# Patient Record
Sex: Female | Born: 1950
Health system: Southern US, Community
[De-identification: ages and names within clinical notes are randomized; demographics above are authoritative.]

## PROBLEM LIST (undated history)

## (undated) DIAGNOSIS — M1990 Unspecified osteoarthritis, unspecified site: Secondary | ICD-10-CM

## (undated) DIAGNOSIS — E039 Hypothyroidism, unspecified: Secondary | ICD-10-CM

## (undated) DIAGNOSIS — E7800 Pure hypercholesterolemia, unspecified: Secondary | ICD-10-CM

## (undated) HISTORY — DX: Hypothyroidism, unspecified: E03.9

## (undated) HISTORY — DX: Unspecified osteoarthritis, unspecified site: M19.90

## (undated) HISTORY — DX: Pure hypercholesterolemia, unspecified: E78.00

---

## 1969-12-31 HISTORY — PX: TONSILLECTOMY: SUR1361

## 1992-12-31 HISTORY — PX: VAGINAL HYSTERECTOMY: SUR661

## 1999-02-27 ENCOUNTER — Other Ambulatory Visit: Admission: RE | Admit: 1999-02-27 | Discharge: 1999-02-27 | Payer: Self-pay | Admitting: *Deleted

## 2000-04-09 ENCOUNTER — Other Ambulatory Visit: Admission: RE | Admit: 2000-04-09 | Discharge: 2000-04-09 | Payer: Self-pay | Admitting: *Deleted

## 2001-04-29 ENCOUNTER — Other Ambulatory Visit: Admission: RE | Admit: 2001-04-29 | Discharge: 2001-04-29 | Payer: Self-pay | Admitting: *Deleted

## 2002-05-05 ENCOUNTER — Other Ambulatory Visit: Admission: RE | Admit: 2002-05-05 | Discharge: 2002-05-05 | Payer: Self-pay | Admitting: *Deleted

## 2003-05-06 ENCOUNTER — Other Ambulatory Visit: Admission: RE | Admit: 2003-05-06 | Discharge: 2003-05-06 | Payer: Self-pay | Admitting: *Deleted

## 2004-05-08 ENCOUNTER — Other Ambulatory Visit: Admission: RE | Admit: 2004-05-08 | Discharge: 2004-05-08 | Payer: Self-pay | Admitting: *Deleted

## 2005-05-09 ENCOUNTER — Other Ambulatory Visit: Admission: RE | Admit: 2005-05-09 | Discharge: 2005-05-09 | Payer: Self-pay | Admitting: *Deleted

## 2006-05-13 ENCOUNTER — Other Ambulatory Visit: Admission: RE | Admit: 2006-05-13 | Discharge: 2006-05-13 | Payer: Self-pay | Admitting: *Deleted

## 2007-05-14 ENCOUNTER — Other Ambulatory Visit: Admission: RE | Admit: 2007-05-14 | Discharge: 2007-05-14 | Payer: Self-pay | Admitting: *Deleted

## 2008-05-17 ENCOUNTER — Other Ambulatory Visit: Admission: RE | Admit: 2008-05-17 | Discharge: 2008-05-17 | Payer: Self-pay | Admitting: Gynecology

## 2009-02-11 ENCOUNTER — Emergency Department (HOSPITAL_COMMUNITY): Admission: EM | Admit: 2009-02-11 | Discharge: 2009-02-11 | Payer: Self-pay | Admitting: Emergency Medicine

## 2009-02-24 ENCOUNTER — Emergency Department (HOSPITAL_COMMUNITY): Admission: EM | Admit: 2009-02-24 | Discharge: 2009-02-24 | Payer: Self-pay | Admitting: Emergency Medicine

## 2011-04-17 LAB — URINALYSIS, ROUTINE W REFLEX MICROSCOPIC
Bilirubin Urine: NEGATIVE
Glucose, UA: NEGATIVE mg/dL
Hgb urine dipstick: NEGATIVE
Specific Gravity, Urine: 1.005 (ref 1.005–1.030)
pH: 7 (ref 5.0–8.0)

## 2012-04-10 ENCOUNTER — Other Ambulatory Visit: Payer: Self-pay | Admitting: Family Medicine

## 2012-09-26 ENCOUNTER — Encounter: Payer: Self-pay | Admitting: Internal Medicine

## 2012-11-24 ENCOUNTER — Ambulatory Visit (AMBULATORY_SURGERY_CENTER): Payer: BC Managed Care – PPO

## 2012-11-24 ENCOUNTER — Encounter: Payer: Self-pay | Admitting: Internal Medicine

## 2012-11-24 VITALS — Ht 65.0 in | Wt 155.0 lb

## 2012-11-24 DIAGNOSIS — Z1211 Encounter for screening for malignant neoplasm of colon: Secondary | ICD-10-CM

## 2012-11-24 MED ORDER — MOVIPREP 100 G PO SOLR: 1.0000 | Freq: Once | ORAL | Status: DC

## 2012-11-26 ENCOUNTER — Telehealth: Payer: Self-pay | Admitting: Internal Medicine

## 2012-12-01 ENCOUNTER — Encounter: Payer: Self-pay | Admitting: Internal Medicine

## 2013-01-07 ENCOUNTER — Encounter: Payer: Self-pay | Admitting: Internal Medicine

## 2013-01-07 ENCOUNTER — Ambulatory Visit (AMBULATORY_SURGERY_CENTER): Payer: BC Managed Care – PPO | Admitting: Internal Medicine

## 2013-01-07 VITALS — BP 134/77 | HR 67 | Temp 97.3°F | Resp 15 | Ht 65.0 in | Wt 155.0 lb

## 2013-01-07 DIAGNOSIS — Z1211 Encounter for screening for malignant neoplasm of colon: Secondary | ICD-10-CM

## 2013-01-07 MED ORDER — SODIUM CHLORIDE 0.9 % IV SOLN: 500.0000 mL | INTRAVENOUS | Status: DC

## 2013-01-07 NOTE — Op Note (Signed)
[facility] [address] [place], [address]   COLONOSCOPY PROCEDURE REPORT  PATIENT: Tracey Johnson, Tracey Johnson  MR#: [id number] BIRTHDATE: 1950/05/02 , 62  yrs. old GENDER: Female ENDOSCOPIST: Roxy Cedar, MD REFERRED XB:[id number] Jacky Kindle, M.D. PROCEDURE DATE:  01/07/2013 PROCEDURE:   Colonoscopy, screening ASA CLASS:   Class II INDICATIONS:average risk screening. MEDICATIONS: MAC sedation, administered by CRNA and propofol (Diprivan) 150mg  IV  DESCRIPTION OF PROCEDURE:   After the risks benefits and alternatives of the procedure were thoroughly explained, informed consent was obtained.  A digital rectal exam revealed no abnormalities of the rectum.   The LB [id number]  endoscope was introduced through the anus and advanced to the cecum, which was identified by both the appendix and ileocecal valve. No adverse events experienced.   The quality of the prep was excellent, using MoviPrep  The instrument was then slowly withdrawn as the colon was fully examined.      COLON FINDINGS: Mild diverticulosis was noted in the sigmoid colon. The colon was otherwise normal.  There was no diverticulosis, inflammation, polyps or cancers unless previously stated. Retroflexed views revealed internal hemorrhoids. The time to cecum=2 minutes 57 seconds.  Withdrawal time=9 minutes 16 seconds. The scope was withdrawn and the procedure completed. COMPLICATIONS: There were no complications.  ENDOSCOPIC IMPRESSION: 1.   Mild diverticulosis was noted in the sigmoid colon 2.   The colon was otherwise normal  RECOMMENDATIONS: 1. Continue current colorectal screening recommendations for "routine risk" patients with a repeat colonoscopy in 10 years.   eSigned:  Roxy Cedar, MD 01/07/2013 3:10 PM   cc: Geoffry Paradise, MD and The Patient

## 2013-01-07 NOTE — Progress Notes (Signed)
Patient did not experience any of the following events: a burn prior to discharge; a fall within the facility; wrong site/side/patient/procedure/implant event; or a hospital transfer or hospital admission upon discharge from the facility. (G8907) Patient did not have preoperative order for IV antibiotic SSI prophylaxis. (G8918)  

## 2013-01-07 NOTE — Progress Notes (Signed)
Lidocaine-40mg IV prior to Propofol InductionPropofol given over incremental dosages 

## 2013-01-07 NOTE — Patient Instructions (Addendum)
Impressions/recommendations:  Diverticulosis (handout given) High fiber diet (handout given)  YOU HAD AN ENDOSCOPIC PROCEDURE TODAY AT THE [facility]: Refer to the procedure report that was given to you for any specific questions about what was found during the examination.  If the procedure report does not answer your questions, please call your gastroenterologist to clarify.  If you requested that your care partner not be given the details of your procedure findings, then the procedure report has been included in a sealed envelope for you to review at your convenience later.  YOU SHOULD EXPECT: Some feelings of bloating in the abdomen. Passage of more gas than usual.  Walking can help get rid of the air that was put into your GI tract during the procedure and reduce the bloating. If you had a lower endoscopy (such as a colonoscopy or flexible sigmoidoscopy) you may notice spotting of blood in your stool or on the toilet paper. If you underwent a bowel prep for your procedure, then you may not have a normal bowel movement for a few days.  DIET: Your first meal following the procedure should be a light meal and then it is ok to progress to your normal diet.  A half-sandwich or bowl of soup is an example of a good first meal.  Heavy or fried foods are harder to digest and may make you feel nauseous or bloated.  Likewise meals heavy in dairy and vegetables can cause extra gas to form and this can also increase the bloating.  Drink plenty of fluids but you should avoid alcoholic beverages for 24 hours.  ACTIVITY: Your care partner should take you home directly after the procedure.  You should plan to take it easy, moving slowly for the rest of the day.  You can resume normal activity the day after the procedure however you should NOT DRIVE or use heavy machinery for 24 hours (because of the sedation medicines used during the test).    SYMPTOMS TO REPORT IMMEDIATELY: A gastroenterologist can  be reached at any hour.  During normal business hours, 8:30 AM to 5:00 PM Monday through Friday, call [phone].  After hours and on weekends, please call the GI answering service at [phone] who will take a message and have the physician on call contact you.   Following lower endoscopy (colonoscopy or flexible sigmoidoscopy):  Excessive amounts of blood in the stool  Significant tenderness or worsening of abdominal pains  Swelling of the abdomen that is new, acute  Fever of 100F or higher  FOLLOW UP: If any biopsies were taken you will be contacted by phone or by letter within the next 1-3 weeks.  Call your gastroenterologist if you have not heard about the biopsies in 3 weeks.  Our staff will call the home number listed on your records the next business day following your procedure to check on you and address any questions or concerns that you may have at that time regarding the information given to you following your procedure. This is a courtesy call and so if there is no answer at the home number and we have not heard from you through the emergency physician on call, we will assume that you have returned to your regular daily activities without incident.  SIGNATURES/CONFIDENTIALITY: You and/or your care partner have signed paperwork which will be entered into your electronic medical record.  These signatures attest to the fact that that the information above on your After Visit Summary has been reviewed   and is understood.  Full responsibility of the confidentiality of this discharge information lies with you and/or your care-partner. 

## 2013-01-08 ENCOUNTER — Telehealth: Payer: Self-pay | Admitting: *Deleted

## 2013-01-08 NOTE — Telephone Encounter (Signed)
  Follow up Call-  Call back number 01/07/2013  Post procedure Call Back phone  # [phone]  Permission to leave phone message Yes     Patient questions:  Do you have a fever, pain , or abdominal swelling? no Pain Score  0 *  Have you tolerated food without any problems? yes  Have you been able to return to your normal activities? yes  Do you have any questions about your discharge instructions: Diet   no Medications  no Follow up visit  no  Do you have questions or concerns about your Care? no  Actions: * If pain score is 4 or above: No action needed, pain <4.

## 2013-03-31 NOTE — Telephone Encounter (Signed)
PT NOT BILLED COLON CX FEE °

## 2014-02-10 ENCOUNTER — Other Ambulatory Visit: Payer: Self-pay | Admitting: Internal Medicine

## 2014-02-10 DIAGNOSIS — M4800 Spinal stenosis, site unspecified: Secondary | ICD-10-CM

## 2014-02-15 ENCOUNTER — Ambulatory Visit
Admission: RE | Admit: 2014-02-15 | Discharge: 2014-02-15 | Disposition: A | Discharge status: Home / Self Care | Payer: 59 | Source: Ambulatory Visit | Attending: Internal Medicine | Admitting: Internal Medicine

## 2014-02-15 DIAGNOSIS — M4800 Spinal stenosis, site unspecified: Secondary | ICD-10-CM

## 2014-02-16 ENCOUNTER — Inpatient Hospital Stay
Admission: RE | Admit: 2014-02-16 | Discharge: 2014-02-16 | Disposition: A | Discharge status: Home / Self Care | Payer: BC Managed Care – PPO | Source: Ambulatory Visit | Attending: Internal Medicine | Admitting: Internal Medicine

## 2014-03-08 ENCOUNTER — Other Ambulatory Visit: Payer: Self-pay | Admitting: Internal Medicine

## 2014-03-08 DIAGNOSIS — M545 Low back pain, unspecified: Secondary | ICD-10-CM

## 2014-03-10 ENCOUNTER — Ambulatory Visit
Admission: RE | Admit: 2014-03-10 | Discharge: 2014-03-10 | Disposition: A | Discharge status: Home / Self Care | Payer: 59 | Source: Ambulatory Visit | Attending: Internal Medicine | Admitting: Internal Medicine

## 2014-03-10 DIAGNOSIS — M545 Low back pain, unspecified: Secondary | ICD-10-CM

## 2014-03-10 MED ORDER — METHYLPREDNISOLONE ACETATE 40 MG/ML INJ SUSP (RADIOLOG
120.0000 mg | Freq: Once | INTRAMUSCULAR | Status: AC
  Administered 2014-03-10: 120 mg via EPIDURAL

## 2014-03-10 MED ORDER — IOHEXOL 180 MG/ML  SOLN
1.0000 mL | Freq: Once | INTRAMUSCULAR | Status: AC | PRN
  Administered 2014-03-10: 1 mL via EPIDURAL

## 2014-03-10 NOTE — Discharge Instructions (Signed)

## 2014-08-06 ENCOUNTER — Other Ambulatory Visit: Payer: Self-pay | Admitting: Internal Medicine

## 2014-08-06 DIAGNOSIS — M545 Low back pain, unspecified: Secondary | ICD-10-CM

## 2014-08-06 DIAGNOSIS — G8929 Other chronic pain: Secondary | ICD-10-CM

## 2014-08-09 ENCOUNTER — Ambulatory Visit
Admission: RE | Admit: 2014-08-09 | Discharge: 2014-08-09 | Disposition: A | Discharge status: Home / Self Care | Payer: 59 | Source: Ambulatory Visit | Attending: Internal Medicine | Admitting: Internal Medicine

## 2014-08-09 VITALS — BP 133/64 | HR 77

## 2014-08-09 DIAGNOSIS — M545 Low back pain, unspecified: Secondary | ICD-10-CM

## 2014-08-09 DIAGNOSIS — G8929 Other chronic pain: Secondary | ICD-10-CM

## 2014-08-09 MED ORDER — METHYLPREDNISOLONE ACETATE 40 MG/ML INJ SUSP (RADIOLOG
120.0000 mg | Freq: Once | INTRAMUSCULAR | Status: AC
  Administered 2014-08-09: 120 mg via EPIDURAL

## 2014-08-09 MED ORDER — IOHEXOL 180 MG/ML  SOLN
1.0000 mL | Freq: Once | INTRAMUSCULAR | Status: AC | PRN
  Administered 2014-08-09: 1 mL via EPIDURAL

## 2014-08-09 NOTE — Discharge Instructions (Signed)

## 2014-11-15 ENCOUNTER — Other Ambulatory Visit: Payer: Self-pay | Admitting: Internal Medicine

## 2014-11-15 DIAGNOSIS — M48061 Spinal stenosis, lumbar region without neurogenic claudication: Secondary | ICD-10-CM

## 2014-12-02 ENCOUNTER — Ambulatory Visit
Admission: RE | Admit: 2014-12-02 | Discharge: 2014-12-02 | Disposition: A | Discharge status: Home / Self Care | Payer: 59 | Source: Ambulatory Visit | Attending: Internal Medicine | Admitting: Internal Medicine

## 2014-12-02 DIAGNOSIS — M48061 Spinal stenosis, lumbar region without neurogenic claudication: Secondary | ICD-10-CM

## 2014-12-02 MED ORDER — METHYLPREDNISOLONE ACETATE 40 MG/ML INJ SUSP (RADIOLOG
120.0000 mg | Freq: Once | INTRAMUSCULAR | Status: AC
  Administered 2014-12-02: 120 mg via EPIDURAL

## 2014-12-02 MED ORDER — IOHEXOL 180 MG/ML  SOLN
1.0000 mL | Freq: Once | INTRAMUSCULAR | Status: AC | PRN
  Administered 2014-12-02: 1 mL via EPIDURAL

## 2015-01-03 ENCOUNTER — Other Ambulatory Visit: Payer: Self-pay | Admitting: Internal Medicine

## 2015-01-03 DIAGNOSIS — M48061 Spinal stenosis, lumbar region without neurogenic claudication: Secondary | ICD-10-CM

## 2015-01-04 ENCOUNTER — Ambulatory Visit
Admission: RE | Admit: 2015-01-04 | Discharge: 2015-01-04 | Disposition: A | Discharge status: Home / Self Care | Payer: PRIVATE HEALTH INSURANCE | Source: Ambulatory Visit | Attending: Internal Medicine | Admitting: Internal Medicine

## 2015-01-04 DIAGNOSIS — M48061 Spinal stenosis, lumbar region without neurogenic claudication: Secondary | ICD-10-CM

## 2015-01-04 MED ORDER — IOHEXOL 180 MG/ML  SOLN
1.0000 mL | Freq: Once | INTRAMUSCULAR | Status: AC | PRN
  Administered 2015-01-04: 1 mL via EPIDURAL

## 2015-01-04 MED ORDER — METHYLPREDNISOLONE ACETATE 40 MG/ML INJ SUSP (RADIOLOG
120.0000 mg | Freq: Once | INTRAMUSCULAR | Status: AC
  Administered 2015-01-04: 120 mg via EPIDURAL

## 2015-06-17 ENCOUNTER — Other Ambulatory Visit: Payer: Self-pay | Admitting: Internal Medicine

## 2015-06-17 DIAGNOSIS — M48061 Spinal stenosis, lumbar region without neurogenic claudication: Secondary | ICD-10-CM

## 2015-06-20 ENCOUNTER — Ambulatory Visit
Admission: RE | Admit: 2015-06-20 | Discharge: 2015-06-20 | Disposition: A | Discharge status: Home / Self Care | Payer: PRIVATE HEALTH INSURANCE | Source: Ambulatory Visit | Attending: Internal Medicine | Admitting: Internal Medicine

## 2015-06-20 DIAGNOSIS — M48061 Spinal stenosis, lumbar region without neurogenic claudication: Secondary | ICD-10-CM

## 2015-06-20 MED ORDER — METHYLPREDNISOLONE ACETATE 40 MG/ML INJ SUSP (RADIOLOG
120.0000 mg | Freq: Once | INTRAMUSCULAR | Status: AC
  Administered 2015-06-20: 120 mg via EPIDURAL

## 2015-06-20 MED ORDER — IOHEXOL 180 MG/ML  SOLN
1.0000 mL | Freq: Once | INTRAMUSCULAR | Status: AC | PRN
  Administered 2015-06-20: 1 mL via EPIDURAL

## 2015-06-20 NOTE — Discharge Instructions (Addendum)

## 2015-11-15 ENCOUNTER — Other Ambulatory Visit: Payer: Self-pay | Admitting: Internal Medicine

## 2015-11-15 DIAGNOSIS — M48061 Spinal stenosis, lumbar region without neurogenic claudication: Secondary | ICD-10-CM

## 2015-11-28 ENCOUNTER — Ambulatory Visit
Admission: RE | Admit: 2015-11-28 | Discharge: 2015-11-28 | Disposition: A | Discharge status: Home / Self Care | Payer: PRIVATE HEALTH INSURANCE | Source: Ambulatory Visit | Attending: Internal Medicine | Admitting: Internal Medicine

## 2015-11-28 DIAGNOSIS — M48061 Spinal stenosis, lumbar region without neurogenic claudication: Secondary | ICD-10-CM

## 2015-11-28 MED ORDER — METHYLPREDNISOLONE ACETATE 40 MG/ML INJ SUSP (RADIOLOG
120.0000 mg | Freq: Once | INTRAMUSCULAR | Status: AC
  Administered 2015-11-28: 120 mg via EPIDURAL

## 2015-11-28 MED ORDER — IOHEXOL 180 MG/ML  SOLN
1.0000 mL | Freq: Once | INTRAMUSCULAR | Status: AC | PRN
  Administered 2015-11-28: 1 mL via EPIDURAL

## 2015-11-28 NOTE — Discharge Instructions (Signed)

## 2016-07-17 ENCOUNTER — Other Ambulatory Visit: Payer: Self-pay | Admitting: Internal Medicine

## 2016-07-17 DIAGNOSIS — M48061 Spinal stenosis, lumbar region without neurogenic claudication: Secondary | ICD-10-CM

## 2016-07-20 ENCOUNTER — Ambulatory Visit
Admission: RE | Admit: 2016-07-20 | Discharge: 2016-07-20 | Disposition: A | Discharge status: Home / Self Care | Payer: PRIVATE HEALTH INSURANCE | Source: Ambulatory Visit | Attending: Internal Medicine | Admitting: Internal Medicine

## 2016-07-20 DIAGNOSIS — M48061 Spinal stenosis, lumbar region without neurogenic claudication: Secondary | ICD-10-CM

## 2016-07-20 MED ORDER — METHYLPREDNISOLONE ACETATE 40 MG/ML INJ SUSP (RADIOLOG
120.0000 mg | Freq: Once | INTRAMUSCULAR | Status: AC
  Administered 2016-07-20: 120 mg via EPIDURAL

## 2016-07-20 MED ORDER — IOPAMIDOL (ISOVUE-M 200) INJECTION 41%
1.0000 mL | Freq: Once | INTRAMUSCULAR | Status: AC
  Administered 2016-07-20: 1 mL via EPIDURAL

## 2016-07-20 NOTE — Discharge Instructions (Signed)

## 2017-02-12 ENCOUNTER — Other Ambulatory Visit: Payer: Self-pay | Admitting: Internal Medicine

## 2017-02-12 DIAGNOSIS — M48061 Spinal stenosis, lumbar region without neurogenic claudication: Secondary | ICD-10-CM

## 2017-02-13 ENCOUNTER — Ambulatory Visit
Admission: RE | Admit: 2017-02-13 | Discharge: 2017-02-13 | Disposition: A | Discharge status: Home / Self Care | Payer: PRIVATE HEALTH INSURANCE | Source: Ambulatory Visit | Attending: Internal Medicine | Admitting: Internal Medicine

## 2017-02-13 DIAGNOSIS — M48061 Spinal stenosis, lumbar region without neurogenic claudication: Secondary | ICD-10-CM

## 2017-02-13 MED ORDER — IOPAMIDOL (ISOVUE-M 200) INJECTION 41%
1.0000 mL | Freq: Once | INTRAMUSCULAR | Status: AC
  Administered 2017-02-13: 1 mL via EPIDURAL

## 2017-02-13 MED ORDER — METHYLPREDNISOLONE ACETATE 40 MG/ML INJ SUSP (RADIOLOG
120.0000 mg | Freq: Once | INTRAMUSCULAR | Status: AC
  Administered 2017-02-13: 120 mg via EPIDURAL

## 2017-02-13 NOTE — Discharge Instructions (Signed)

## 2017-08-26 ENCOUNTER — Other Ambulatory Visit: Payer: Self-pay | Admitting: Internal Medicine

## 2017-08-26 DIAGNOSIS — M47816 Spondylosis without myelopathy or radiculopathy, lumbar region: Secondary | ICD-10-CM

## 2017-09-04 ENCOUNTER — Ambulatory Visit
Admission: RE | Admit: 2017-09-04 | Discharge: 2017-09-04 | Disposition: A | Discharge status: Home / Self Care | Payer: PRIVATE HEALTH INSURANCE | Source: Ambulatory Visit | Attending: Internal Medicine | Admitting: Internal Medicine

## 2017-09-04 DIAGNOSIS — M47816 Spondylosis without myelopathy or radiculopathy, lumbar region: Secondary | ICD-10-CM

## 2017-09-04 MED ORDER — METHYLPREDNISOLONE ACETATE 40 MG/ML INJ SUSP (RADIOLOG
120.0000 mg | Freq: Once | INTRAMUSCULAR | Status: AC
  Administered 2017-09-04: 120 mg via EPIDURAL

## 2017-09-04 MED ORDER — IOPAMIDOL (ISOVUE-M 200) INJECTION 41%
1.0000 mL | Freq: Once | INTRAMUSCULAR | Status: AC
  Administered 2017-09-04: 1 mL via EPIDURAL

## 2017-09-04 NOTE — Discharge Instructions (Signed)

## 2017-09-30 ENCOUNTER — Inpatient Hospital Stay (HOSPITAL_COMMUNITY): Payer: Medicare Other

## 2017-09-30 ENCOUNTER — Inpatient Hospital Stay (HOSPITAL_COMMUNITY)
Admission: EM | Admit: 2017-09-30 | Discharge: 2017-10-05 | DRG: 871 | Disposition: A | Discharge status: Home / Self Care | Payer: Medicare Other | Attending: Internal Medicine | Admitting: Internal Medicine

## 2017-09-30 ENCOUNTER — Encounter (HOSPITAL_COMMUNITY): Payer: Self-pay | Admitting: *Deleted

## 2017-09-30 ENCOUNTER — Emergency Department (HOSPITAL_COMMUNITY): Payer: Medicare Other

## 2017-09-30 DIAGNOSIS — A419 Sepsis, unspecified organism: Secondary | ICD-10-CM | POA: Diagnosis not present

## 2017-09-30 DIAGNOSIS — R109 Unspecified abdominal pain: Secondary | ICD-10-CM

## 2017-09-30 DIAGNOSIS — D65 Disseminated intravascular coagulation [defibrination syndrome]: Secondary | ICD-10-CM | POA: Diagnosis present

## 2017-09-30 DIAGNOSIS — D696 Thrombocytopenia, unspecified: Secondary | ICD-10-CM | POA: Diagnosis present

## 2017-09-30 DIAGNOSIS — A0472 Enterocolitis due to Clostridium difficile, not specified as recurrent: Secondary | ICD-10-CM | POA: Diagnosis present

## 2017-09-30 DIAGNOSIS — A414 Sepsis due to anaerobes: Principal | ICD-10-CM | POA: Diagnosis present

## 2017-09-30 DIAGNOSIS — I340 Nonrheumatic mitral (valve) insufficiency: Secondary | ICD-10-CM | POA: Diagnosis not present

## 2017-09-30 DIAGNOSIS — R6521 Severe sepsis with septic shock: Secondary | ICD-10-CM | POA: Diagnosis present

## 2017-09-30 DIAGNOSIS — D649 Anemia, unspecified: Secondary | ICD-10-CM | POA: Diagnosis present

## 2017-09-30 DIAGNOSIS — N1330 Unspecified hydronephrosis: Secondary | ICD-10-CM | POA: Diagnosis present

## 2017-09-30 DIAGNOSIS — F17210 Nicotine dependence, cigarettes, uncomplicated: Secondary | ICD-10-CM | POA: Diagnosis present

## 2017-09-30 DIAGNOSIS — E876 Hypokalemia: Secondary | ICD-10-CM | POA: Diagnosis present

## 2017-09-30 DIAGNOSIS — R51 Headache: Secondary | ICD-10-CM | POA: Diagnosis present

## 2017-09-30 DIAGNOSIS — N201 Calculus of ureter: Secondary | ICD-10-CM | POA: Diagnosis present

## 2017-09-30 DIAGNOSIS — Z791 Long term (current) use of non-steroidal anti-inflammatories (NSAID): Secondary | ICD-10-CM | POA: Diagnosis not present

## 2017-09-30 DIAGNOSIS — R0602 Shortness of breath: Secondary | ICD-10-CM | POA: Diagnosis present

## 2017-09-30 DIAGNOSIS — E872 Acidosis, unspecified: Secondary | ICD-10-CM

## 2017-09-30 DIAGNOSIS — R6510 Systemic inflammatory response syndrome (SIRS) of non-infectious origin without acute organ dysfunction: Secondary | ICD-10-CM | POA: Diagnosis not present

## 2017-09-30 DIAGNOSIS — E039 Hypothyroidism, unspecified: Secondary | ICD-10-CM | POA: Diagnosis present

## 2017-09-30 DIAGNOSIS — N179 Acute kidney failure, unspecified: Secondary | ICD-10-CM | POA: Diagnosis present

## 2017-09-30 DIAGNOSIS — Z79899 Other long term (current) drug therapy: Secondary | ICD-10-CM | POA: Diagnosis not present

## 2017-09-30 DIAGNOSIS — Z452 Encounter for adjustment and management of vascular access device: Secondary | ICD-10-CM

## 2017-09-30 DIAGNOSIS — E7800 Pure hypercholesterolemia, unspecified: Secondary | ICD-10-CM | POA: Diagnosis present

## 2017-09-30 DIAGNOSIS — Z90710 Acquired absence of both cervix and uterus: Secondary | ICD-10-CM | POA: Diagnosis not present

## 2017-09-30 LAB — TSH
TSH: 2.521 u[IU]/mL (ref 0.350–4.500)
TSH: 0.899 u[IU]/mL (ref 0.350–4.500)

## 2017-09-30 LAB — COMPREHENSIVE METABOLIC PANEL
ALBUMIN: 3.7 g/dL (ref 3.5–5.0)
ALK PHOS: 146 U/L — AB (ref 38–126)
ALT: 30 U/L (ref 14–54)
AST: 40 U/L (ref 15–41)
Anion gap: 14 (ref 5–15)
BILIRUBIN TOTAL: 1.1 mg/dL (ref 0.3–1.2)
BUN: 21 mg/dL — AB (ref 6–20)
CO2: 19 mmol/L — AB (ref 22–32)
Calcium: 9.6 mg/dL (ref 8.9–10.3)
Chloride: 110 mmol/L (ref 101–111)
Creatinine, Ser: 1.46 mg/dL — ABNORMAL HIGH (ref 0.44–1.00)
GFR calc Af Amer: 41 mL/min — ABNORMAL LOW (ref >60)
GFR calc non Af Amer: 36 mL/min — ABNORMAL LOW (ref >60)
GLUCOSE: 94 mg/dL (ref 65–99)
POTASSIUM: 3.4 mmol/L — AB (ref 3.5–5.1)
Sodium: 143 mmol/L (ref 135–145)
TOTAL PROTEIN: 6.8 g/dL (ref 6.5–8.1)

## 2017-09-30 LAB — CBC WITH DIFFERENTIAL/PLATELET
BASOS ABS: 0 10*3/uL (ref 0.0–0.1)
Basophils Relative: 0 %
Eosinophils Absolute: 0 10*3/uL (ref 0.0–0.7)
Eosinophils Relative: 0 %
HCT: 44.7 % (ref 36.0–46.0)
Hemoglobin: 15 g/dL (ref 12.0–15.0)
LYMPHS PCT: 17 %
Lymphs Abs: 0.4 10*3/uL — ABNORMAL LOW (ref 0.7–4.0)
MCH: 29.6 pg (ref 26.0–34.0)
MCHC: 33.6 g/dL (ref 30.0–36.0)
MCV: 88.2 fL (ref 78.0–100.0)
MONO ABS: 0 10*3/uL — AB (ref 0.1–1.0)
MONOS PCT: 0 %
Neutro Abs: 2 10*3/uL (ref 1.7–7.7)
Neutrophils Relative %: 83 %
PLATELETS: 189 10*3/uL (ref 150–400)
RBC: 5.07 MIL/uL (ref 3.87–5.11)
RDW: 13.7 % (ref 11.5–15.5)
WBC: 2.4 10*3/uL — ABNORMAL LOW (ref 4.0–10.5)

## 2017-09-30 LAB — I-STAT TROPONIN, ED: Troponin i, poc: 0.02 ng/mL (ref 0.00–0.08)

## 2017-09-30 LAB — CBC
HCT: 36.1 % (ref 36.0–46.0)
Hemoglobin: 11.9 g/dL — ABNORMAL LOW (ref 12.0–15.0)
MCH: 29.5 pg (ref 26.0–34.0)
MCHC: 33 g/dL (ref 30.0–36.0)
MCV: 89.6 fL (ref 78.0–100.0)
PLATELETS: 168 10*3/uL (ref 150–400)
RBC: 4.03 MIL/uL (ref 3.87–5.11)
RDW: 14.1 % (ref 11.5–15.5)
WBC: 18.5 10*3/uL — AB (ref 4.0–10.5)

## 2017-09-30 LAB — COOXEMETRY PANEL
Carboxyhemoglobin: 1.2 % (ref 0.5–1.5)
Methemoglobin: 0.7 % (ref 0.0–1.5)
O2 Saturation: 60 %
TOTAL HEMOGLOBIN: 13.1 g/dL (ref 12.0–16.0)

## 2017-09-30 LAB — PROTIME-INR
INR: 1.44
Prothrombin Time: 17.4 seconds — ABNORMAL HIGH (ref 11.4–15.2)

## 2017-09-30 LAB — C DIFFICILE QUICK SCREEN W PCR REFLEX
C Diff antigen: POSITIVE — AB
C Diff toxin: NEGATIVE

## 2017-09-30 LAB — URINALYSIS, ROUTINE W REFLEX MICROSCOPIC
Bilirubin Urine: NEGATIVE
Glucose, UA: NEGATIVE mg/dL
KETONES UR: NEGATIVE mg/dL
Nitrite: NEGATIVE
PROTEIN: 100 mg/dL — AB
Specific Gravity, Urine: 1.015 (ref 1.005–1.030)
pH: 5 (ref 5.0–8.0)

## 2017-09-30 LAB — PROCALCITONIN: PROCALCITONIN: 38.77 ng/mL

## 2017-09-30 LAB — I-STAT CG4 LACTIC ACID, ED
LACTIC ACID, VENOUS: 4.72 mmol/L — AB (ref 0.5–1.9)
Lactic Acid, Venous: 2.79 mmol/L (ref 0.5–1.9)

## 2017-09-30 LAB — CREATININE, SERUM
CREATININE: 1.21 mg/dL — AB (ref 0.44–1.00)
GFR calc non Af Amer: 45 mL/min — ABNORMAL LOW (ref >60)
GFR, EST AFRICAN AMERICAN: 52 mL/min — AB (ref >60)

## 2017-09-30 LAB — TROPONIN I: Troponin I: 0.06 ng/mL (ref <0.03)

## 2017-09-30 LAB — CORTISOL
Cortisol, Plasma: 30.1 ug/dL
Cortisol, Plasma: 27.7 ug/dL

## 2017-09-30 LAB — TYPE AND SCREEN
ABO/RH(D): B POS
ANTIBODY SCREEN: NEGATIVE

## 2017-09-30 LAB — AMYLASE: Amylase: 25 U/L — ABNORMAL LOW (ref 28–100)

## 2017-09-30 LAB — INFLUENZA PANEL BY PCR (TYPE A & B)
INFLAPCR: NEGATIVE
Influenza B By PCR: NEGATIVE

## 2017-09-30 LAB — ABO/RH: ABO/RH(D): B POS

## 2017-09-30 LAB — APTT: aPTT: 41 seconds — ABNORMAL HIGH (ref 24–36)

## 2017-09-30 LAB — LACTIC ACID, PLASMA: Lactic Acid, Venous: 2 mmol/L (ref 0.5–1.9)

## 2017-09-30 LAB — LIPASE, BLOOD: LIPASE: 23 U/L (ref 11–51)

## 2017-09-30 LAB — STREP PNEUMONIAE URINARY ANTIGEN: STREP PNEUMO URINARY ANTIGEN: NEGATIVE

## 2017-09-30 LAB — T4, FREE: Free T4: 1.47 ng/dL — ABNORMAL HIGH (ref 0.61–1.12)

## 2017-09-30 MED ORDER — SODIUM CHLORIDE 0.9 % IV BOLUS (SEPSIS)
250.0000 mL | Freq: Once | INTRAVENOUS | Status: AC
  Administered 2017-09-30: 250 mL via INTRAVENOUS

## 2017-09-30 MED ORDER — DEXTROSE IN LACTATED RINGERS 5 % IV SOLN
INTRAVENOUS | Status: DC
  Administered 2017-09-30: 125 mL via INTRAVENOUS
  Administered 2017-09-30 – 2017-10-02 (×2): via INTRAVENOUS
  Administered 2017-10-02: 125 mL/h via INTRAVENOUS
  Administered 2017-10-02 – 2017-10-04 (×5): via INTRAVENOUS

## 2017-09-30 MED ORDER — DEXTROSE 5 % IV SOLN
0.0000 ug/min | Freq: Once | INTRAVENOUS | Status: AC
  Administered 2017-09-30: 2 ug/min via INTRAVENOUS
  Filled 2017-09-30: qty 4

## 2017-09-30 MED ORDER — SODIUM CHLORIDE 0.9 % IV SOLN: 250.0000 mL | INTRAVENOUS | Status: DC | PRN

## 2017-09-30 MED ORDER — OSELTAMIVIR PHOSPHATE 75 MG PO CAPS
75.0000 mg | ORAL_CAPSULE | Freq: Two times a day (BID) | ORAL | Status: DC
  Administered 2017-09-30: 75 mg via ORAL
  Filled 2017-09-30 (×2): qty 1

## 2017-09-30 MED ORDER — SODIUM CHLORIDE 0.9 % IV BOLUS (SEPSIS)
1000.0000 mL | Freq: Once | INTRAVENOUS | Status: AC
  Administered 2017-09-30: 1000 mL via INTRAVENOUS

## 2017-09-30 MED ORDER — METRONIDAZOLE IN NACL 5-0.79 MG/ML-% IV SOLN
500.0000 mg | Freq: Once | INTRAVENOUS | Status: AC
  Administered 2017-09-30: 500 mg via INTRAVENOUS
  Filled 2017-09-30: qty 100

## 2017-09-30 MED ORDER — SODIUM CHLORIDE 0.9% FLUSH
10.0000 mL | INTRAVENOUS | Status: DC | PRN
  Administered 2017-10-05: 20 mL
  Filled 2017-09-30: qty 40

## 2017-09-30 MED ORDER — VANCOMYCIN HCL IN DEXTROSE 1-5 GM/200ML-% IV SOLN
1000.0000 mg | Freq: Once | INTRAVENOUS | Status: AC
  Administered 2017-09-30: 1000 mg via INTRAVENOUS
  Filled 2017-09-30: qty 200

## 2017-09-30 MED ORDER — DEXTROSE 5 % IV SOLN
2.0000 g | Freq: Once | INTRAVENOUS | Status: AC
  Administered 2017-09-30: 2 g via INTRAVENOUS
  Filled 2017-09-30: qty 2

## 2017-09-30 MED ORDER — HEPARIN SODIUM (PORCINE) 5000 UNIT/ML IJ SOLN
5000.0000 [IU] | Freq: Three times a day (TID) | INTRAMUSCULAR | Status: DC
  Administered 2017-09-30 – 2017-10-05 (×14): 5000 [IU] via SUBCUTANEOUS
  Filled 2017-09-30 (×14): qty 1

## 2017-09-30 MED ORDER — ONDANSETRON HCL 4 MG/2ML IJ SOLN
4.0000 mg | Freq: Four times a day (QID) | INTRAMUSCULAR | Status: DC | PRN
  Administered 2017-09-30 – 2017-10-03 (×4): 4 mg via INTRAVENOUS
  Filled 2017-09-30 (×4): qty 2

## 2017-09-30 MED ORDER — LEVOFLOXACIN IN D5W 750 MG/150ML IV SOLN: 750.0000 mg | INTRAVENOUS | Status: DC

## 2017-09-30 MED ORDER — IOPAMIDOL (ISOVUE-300) INJECTION 61%: 100.0000 mL | Freq: Once | INTRAVENOUS | Status: DC | PRN

## 2017-09-30 MED ORDER — LEVOFLOXACIN IN D5W 750 MG/150ML IV SOLN
750.0000 mg | Freq: Once | INTRAVENOUS | Status: AC
  Administered 2017-09-30: 750 mg via INTRAVENOUS
  Filled 2017-09-30: qty 150

## 2017-09-30 MED ORDER — POTASSIUM CHLORIDE 10 MEQ/100ML IV SOLN
10.0000 meq | INTRAVENOUS | Status: AC
  Administered 2017-09-30 (×4): 10 meq via INTRAVENOUS
  Filled 2017-09-30 (×2): qty 100

## 2017-09-30 MED ORDER — DEXTROSE 5 % IV SOLN
1.0000 g | INTRAVENOUS | Status: DC
  Filled 2017-09-30: qty 1

## 2017-09-30 MED ORDER — LEVOTHYROXINE SODIUM 50 MCG PO TABS
75.0000 ug | ORAL_TABLET | Freq: Every day | ORAL | Status: DC
  Administered 2017-10-01 – 2017-10-05 (×5): 75 ug via ORAL
  Filled 2017-09-30 (×5): qty 1

## 2017-09-30 MED ORDER — SODIUM CHLORIDE 0.9 % IV SOLN: INTRAVENOUS | Status: DC

## 2017-09-30 MED ORDER — GUAIFENESIN-DM 100-10 MG/5ML PO SYRP
5.0000 mL | ORAL_SOLUTION | ORAL | Status: DC | PRN
  Administered 2017-09-30 – 2017-10-02 (×3): 5 mL via ORAL
  Filled 2017-09-30 (×3): qty 10

## 2017-09-30 MED ORDER — LACTATED RINGERS IV BOLUS (SEPSIS)
1000.0000 mL | Freq: Once | INTRAVENOUS | Status: AC
  Administered 2017-09-30: 1000 mL via INTRAVENOUS

## 2017-09-30 MED ORDER — VANCOMYCIN HCL IN DEXTROSE 1-5 GM/200ML-% IV SOLN: 1000.0000 mg | INTRAVENOUS | Status: DC

## 2017-09-30 MED ORDER — NOREPINEPHRINE BITARTRATE 1 MG/ML IV SOLN
5.0000 ug/min | INTRAVENOUS | Status: DC
  Administered 2017-09-30: 5 ug/min via INTRAVENOUS
  Administered 2017-09-30: 10 ug/min via INTRAVENOUS
  Administered 2017-10-01: 8 ug/min via INTRAVENOUS
  Administered 2017-10-01: 10 ug/min via INTRAVENOUS
  Filled 2017-09-30 (×4): qty 4

## 2017-09-30 MED ORDER — DEXTROSE 5 % IV SOLN: 2.0000 g | Freq: Once | INTRAVENOUS | Status: DC

## 2017-09-30 MED ORDER — POTASSIUM CHLORIDE 10 MEQ/100ML IV SOLN
INTRAVENOUS | Status: AC
  Filled 2017-09-30: qty 100

## 2017-09-30 MED ORDER — SODIUM CHLORIDE 0.9 % IV SOLN: 500.0000 mL | INTRAVENOUS | Status: DC | PRN

## 2017-09-30 MED ORDER — IOPAMIDOL (ISOVUE-300) INJECTION 61%
INTRAVENOUS | Status: AC
  Filled 2017-09-30: qty 100

## 2017-09-30 MED ORDER — METRONIDAZOLE IN NACL 5-0.79 MG/ML-% IV SOLN
500.0000 mg | Freq: Three times a day (TID) | INTRAVENOUS | Status: DC
  Administered 2017-09-30 – 2017-10-04 (×12): 500 mg via INTRAVENOUS
  Filled 2017-09-30 (×14): qty 100

## 2017-09-30 MED ORDER — CHLORHEXIDINE GLUCONATE CLOTH 2 % EX PADS
6.0000 | MEDICATED_PAD | Freq: Every day | CUTANEOUS | Status: DC
  Administered 2017-10-01 – 2017-10-05 (×5): 6 via TOPICAL

## 2017-09-30 MED ORDER — VANCOMYCIN HCL IN DEXTROSE 1-5 GM/200ML-% IV SOLN: 1000.0000 mg | Freq: Once | INTRAVENOUS | Status: DC

## 2017-09-30 MED ORDER — SODIUM CHLORIDE 0.9% FLUSH
10.0000 mL | Freq: Two times a day (BID) | INTRAVENOUS | Status: DC
  Administered 2017-10-01 – 2017-10-04 (×5): 10 mL

## 2017-09-30 MED ORDER — CHLORHEXIDINE GLUCONATE CLOTH 2 % EX PADS
6.0000 | MEDICATED_PAD | Freq: Every day | CUTANEOUS | Status: DC
  Administered 2017-10-01 – 2017-10-05 (×4): 6 via TOPICAL

## 2017-09-30 NOTE — Progress Notes (Signed)
eLink Physician-Brief Progress Note Patient Name: Tracey Johnson DOB: Jun 27, 1950 MRN: [id number]   Date of Service  09/30/2017  HPI/Events of Note  Nausea Congested cough  eICU Interventions  Zofran prn Robitussin DM prn     Intervention Category Minor Interventions: Routine modifications to care plan (e.g. PRN medications for pain, fever)  Max Fickle 09/30/2017, 7:44 PM

## 2017-09-30 NOTE — H&P (Signed)
PULMONARY / CRITICAL CARE MEDICINE   Name: Tracey Johnson MRN: [id number] DOB: 1950-06-09    ADMISSION DATE:  09/30/2017 CONSULTATION DATE:  10/1  REFERRING MD:  Erma Heritage  CHIEF COMPLAINT:  Hypotension and possible sepsis  HISTORY OF PRESENT ILLNESS:   67 year old female patient past medical history of hypothyroidism. Presented to the emergency room on 10/1 with chief complaint of generalized fatigue and, weakness, nausea, hot and cold flashes, intermittent chills, rigors, myalgias and diaphoresis. Has chronic cough.  Denies significant abdominal discomfort. But did have on epidose of vomiting last night. Symptoms have been progressive in nature over the last 24 hrs and because of this she sought out emergency room evaluation. ER course: Her initial blood pressure was in the 70s/50s, heart rate 120s, white blood cell count 2.4 initial lactic acid was 4.72. IV fluid resuscitation was initiated and empiric antibiotics were infused with working diagnosis of sepsis of unknown etiology. Critical care asked to evaluate as patient remained hypotensive after 30 ml/kg fluid bolus.   PAST MEDICAL HISTORY :  She  has a past medical history of Arthritis; Hypercholesterolemia; and Hypothyroidism.  PAST SURGICAL HISTORY: She  has a past surgical history that includes Tonsillectomy (1971) and Vaginal hysterectomy (1994).  Allergies  Allergen Reactions  . Erythromycin Rash    "most antibiotics"  . Penicillins Rash    "Most antibiotics" Has patient had a PCN reaction causing immediate rash, facial/tongue/throat swelling, SOB or lightheadedness with hypotension: Yes Has patient had a PCN reaction causing severe rash involving mucus membranes or skin necrosis: yes Has patient had a PCN reaction that required hospitalization:No Has patient had a PCN reaction occurring within the last 10 years:No If all of the above answers are "NO", then may proceed with Cephalosporin use.   . Sulfa Antibiotics Rash     "most antibiotics"    No current facility-administered medications on file prior to encounter.    Current Outpatient Prescriptions on File Prior to Encounter  Medication Sig  . pravastatin (PRAVACHOL) 40 MG tablet Take 40 mg by mouth daily.   Marland Kitchen tiZANidine (ZANAFLEX) 2 MG tablet Take one tablet by mouth twice daily.  Marland Kitchen VIVELLE-DOT 0.075 MG/24HR APPLY 1 PATCH TWICE WEEKLY    FAMILY HISTORY:  Her has no family status information on file.    SOCIAL HISTORY: She  reports that she has been smoking Cigarettes.  She started smoking about 45 years ago. She has been smoking about 1.00 pack per day. She has never used smokeless tobacco.  REVIEW OF SYSTEMS:   Gen.: Endorses fevers, chills, myalgias, Rigors HEENT denies headache, nasal discharge, visual or hearing changes. Denies stiff neck, has had sore throat. Pulmonary: Has chronic cough without mucus production, no chest pain no wheezing. Does feel shortness of breath, she notes this primarily when she developed chills cardiac: No chest pain no palpitation no lightheadedness abdomen: Some nausea, this is subsided one episode of vomiting. No pain no diarrhea no blood from rectum GU denies urinary frequency hasn't been seen or pain neuro: No focal deficits no headache no neck stiffness no new back pain or lower extremity pain or paresthesias endocrine: Denies hot or cold intolerance no hair loss no weight changes  SUBJECTIVE:  Feels terrible  VITAL SIGNS: BP (!) 78/54   Pulse (!) 107   Temp 99.8 F (37.7 C) (Rectal)   Resp (!) 25   Wt 155 lb (70.3 kg)   SpO2 94%   BMI 25.79 kg/m   HEMODYNAMICS:    VENTILATOR  SETTINGS:    INTAKE / OUTPUT: No intake/output data recorded.  PHYSICAL EXAMINATION: General:  This is a previously healthy 67 year old female currently appearing toxic in the emergency room. She has intermittent fever in chills. She's currently in no distress. Neuro:  Awake alert oriented, no nuchal rigidity, no focal motor  weakness, particularly in the lower extremities HEENT:  Normocephalic atraumatic she's got no jugular venous distention her lips are cracked her mucous membranes are little dry Cardiovascular:  Tachycardic, normal regular rate and rhythm otherwise no JVD  Lungs:  Clear to auscultation no accessory muscle use Abdomen:  No pain, no organomegaly, positive bowel sounds Musculoskeletal:  Equal strength and bulk Skin:  Warm dry and intact with brisk capillary refill there is no edema  LABS:  BMET  Recent Labs Lab 09/30/17 0822  NA 143  K 3.4*  CL 110  CO2 19*  BUN 21*  CREATININE 1.46*  GLUCOSE 94    Electrolytes  Recent Labs Lab 09/30/17 0822  CALCIUM 9.6    CBC  Recent Labs Lab 09/30/17 0822  WBC 2.4*  HGB 15.0  HCT 44.7  PLT 189    Coag's No results for input(s): APTT, INR in the last 168 hours.  Sepsis Markers  Recent Labs Lab 09/30/17 0908 09/30/17 1306  LATICACIDVEN 4.72* 2.79*    ABG No results for input(s): PHART, PCO2ART, PO2ART in the last 168 hours.  Liver Enzymes  Recent Labs Lab 09/30/17 0822  AST 40  ALT 30  ALKPHOS 146*  BILITOT 1.1  ALBUMIN 3.7    Cardiac Enzymes No results for input(s): TROPONINI, PROBNP in the last 168 hours.  Glucose No results for input(s): GLUCAP in the last 168 hours.  Imaging Dg Chest Port 1 View  Result Date: 09/30/2017 CLINICAL DATA:  Cough, weakness, chills, and nausea. Smoking history. EXAM: PORTABLE CHEST 1 VIEW COMPARISON:  None. FINDINGS: The cardiomediastinal silhouette is within normal limits. There is minimal central airway thickening. No confluent airspace opacity, edema, pleural effusion, or pneumothorax is identified. No acute osseous abnormality is seen. IMPRESSION: Minimal bronchitic changes. Electronically Signed   By: Sebastian Ache M.D.   On: 09/30/2017 09:23     STUDIES:    CULTURES: Blood cultures 10/1>>> UC 10/1>>> resp virus panel 10/1>>> Influenza 10/1>> u strep  10/1>>>    ANTIBIOTICS: levaquin 10/1>>> Cefepime 10/1>>> vanc 10/1>>> tamiflu 10/1>>>  SIGNIFICANT EVENTS:   LINES/TUBES:   DISCUSSION: This is a 67 year old female with what appears to be severe sepsis/septic shock, with what is likely viral prodrome. Very concerned about influenza given onset and constellation of symptoms. She does have a remote history of back pain from arthritis she gets injections into the lumbar area. Her last cortisol injection was approximately 3 weeks ago. She denies any new pain or lower extremity or new paresthesias. If Blood cultures come back positive we would need to add consideration for epidural abscess, however her symptoms do not seem consistent with this currently. In her post resuscitation evaluation performed by critical care her lactic acid has cleared from 4.72 down to 2.79, she is awake, oriented, she remains tachycardic, her capillary refill is brisk and extremities are warm. She is awake and oriented. In spite of this her systolic blood pressure remains in the 60s to 80s. Because of this we will admit her to the intensive care, if blood pressure does not improve upon arrival, she will need central access for further vasoactive drip titration   ASSESSMENT / PLAN:  Severe sepsis/septic shock  etiology unclear, however does sound like viral prodrome. Certainly influenza very high on differential Plan Panculture, send respiratory viral panel and continue empiric antibiotics, as well as Tamiflu Continue IV fluid resuscitation efforts, with clear chest x-ray we'll repeat another liter of crystalloid, if she remains hypotensive in spite of this or requires vasoactive drips she will need central access Check cortisol GOAL map > 65 Goal [id number] IF Central access placed  Check pro calcitonin Add stress dose steroids after cortisol sent   Acute renal failure Plan Renal dose medications Ensure mean arterial pressure greater than 65 Strict I&O Repeat  a.m. chemistry  Lactic acidosis -->clearing  Plan Repeat lactic acid Continue resuscitation efforts as mentioned above   nausea and vomiting Plan Send amylase and lipase Will hold off on abd/pelvis CT for now   H/o tobacco abuse w/ chronic cough pcxr personally reviewed. There is no evidence of PNA Plan Pulse ox Repeat CXR in am   hypokalemia Plan Replace and recheck  History of hypothyroidism Plan Check TSH  FAMILY  - Updates:   - Inter-disciplinary family meet or Palliative Care meeting due by:10/8   My critical care time 40 minutes  Simonne Martinet ACNP-BC [facility] Pulmonary/Critical Care Pager # [phone] OR # [phone] if no answer  09/30/2017, 1:34 PM

## 2017-09-30 NOTE — Progress Notes (Signed)
A consult was received from an ED physician for vancomcyin per pharmacy dosing.  The patient's profile has been reviewed.  RN reports weight is 155 lb. SCr 1.46.  LA elevated.  Concern for Sepsis. A one time order has been placed for vancomycin 1g.  Further antibiotics/pharmacy consults should be ordered by admitting physician if indicated.                       Thank you, Clance Boll, PharmD, BCPS Pager: [phone] 09/30/2017 10:01 AM

## 2017-09-30 NOTE — Procedures (Signed)
Central Venous Catheter Insertion Procedure Note Tracey Johnson [id number] 26-Aug-1950  Procedure: Insertion of Central Venous Catheter Indications: Assessment of intravascular volume, Drug and/or fluid administration and Frequent blood sampling  Procedure Details Consent: Risks of procedure as well as the alternatives and risks of each were explained to the (patient/caregiver).  Consent for procedure obtained. Time Out: Verified patient identification, verified procedure, site/side was marked, verified correct patient position, special equipment/implants available, medications/allergies/relevent history reviewed, required imaging and test results available.  Performed Real time [place] was used to ID and cannulate the vessel  Maximum sterile technique was used including antiseptics, cap, gloves, gown, hand hygiene, mask and sheet. Skin prep: Chlorhexidine; local anesthetic administered A antimicrobial bonded/coated triple lumen catheter was placed in the right internal jugular vein using the Seldinger technique.  Evaluation Blood flow good Complications: No apparent complications Patient did tolerate procedure well. Chest X-ray ordered to verify placement.  CXR: pending.  Tracey Johnson 09/30/2017, 3:17 PM  Simonne Martinet ACNP-BC [facility] Pulmonary/Critical Care Pager # [phone] OR # [phone] if no answer

## 2017-09-30 NOTE — ED Triage Notes (Addendum)
Pt reports feeling nauseated and having chills since last night. Pt is diaphoretic, tachycardic and hypotensive. Denies chest pain, does c/o neck and back pain

## 2017-09-30 NOTE — ED Provider Notes (Signed)
[facility]-EMERGENCY DEPT Provider Note   CSN: [id number] Arrival date & time: 09/30/17  0756     History   Chief Complaint Chief Complaint  Patient presents with  . Emesis  . Shortness of Breath    HPI Tracey Johnson is a 67 y.o. female.  HPI   67 year old female with history of hyperlipidemia here with generalized fatigue. The patient states that the patient's symptoms started approximately 2-3 days ago. She states that she began feeling generally fatigued and weak. She began to feel nauseous and had hot flashes. Since then, she has felt warm, with intermittent chills, and sweating. She has had a mild, productive cough. She's also had nausea and vomiting. Denies any overt abdominal pain however. No flank pain. No vaginal bleeding or discharge.  Past Medical History:  Diagnosis Date  . Arthritis   . Hypercholesterolemia   . Hypothyroidism     Patient Active Problem List   Diagnosis Date Noted  . Sepsis (HCC) 09/30/2017    Past Surgical History:  Procedure Laterality Date  . TONSILLECTOMY  1971  . VAGINAL HYSTERECTOMY  1994    OB History    No data available       Home Medications    Prior to Admission medications   Medication Sig Start Date End Date Taking? Authorizing Provider  HYDROcodone-acetaminophen (NORCO/VICODIN) 5-325 MG tablet Take 1 tablet by mouth every 6 (six) hours as needed for moderate pain.   Yes [provider]  levothyroxine (SYNTHROID, LEVOTHROID) 75 MCG tablet Take 75 mcg by mouth daily before breakfast.   Yes [provider]  naproxen (NAPROSYN) 500 MG tablet Take 500 mg by mouth 2 (two) times daily with a meal.   Yes [provider]  pravastatin (PRAVACHOL) 40 MG tablet Take 40 mg by mouth daily.  09/26/12  Yes [provider]  tiZANidine (ZANAFLEX) 2 MG tablet Take one tablet by mouth twice daily.   Yes [provider]  VIVELLE-DOT 0.075 MG/24HR APPLY 1 PATCH TWICE WEEKLY 04/10/12  Yes Rickard Patience, PA-C    Family History Family History  Problem Relation Age of Onset  . Colon cancer Neg Hx     Social History Social History  Substance Use Topics  . Smoking status: Current Every Day Smoker    Packs/day: 1.00    Types: Cigarettes    Start date: 11/25/1971  . Smokeless tobacco: Never Used  . Alcohol use Not on file     Allergies   Erythromycin; Penicillins; and Sulfa antibiotics   Review of Systems Review of Systems  Constitutional: Positive for chills, fatigue and fever.  Respiratory: Positive for cough.   Gastrointestinal: Positive for nausea and vomiting.  Neurological: Positive for weakness and light-headedness.  All other systems reviewed and are negative.    Physical Exam Updated Vital Signs BP 113/63   Pulse (!) 101   Temp 98.3 F (36.8 C) (Oral)   Resp (!) 23   Ht  (1.651 m)   Wt 76.3 kg (168 lb 3.4 oz)   SpO2 95%   BMI 27.99 kg/m   Physical Exam  Constitutional: She is oriented to person, place, and time. She appears well-developed and well-nourished. She appears distressed.  HENT:  Head: Normocephalic and atraumatic.  Dry MM  Eyes: Conjunctivae are normal.  Neck: Neck supple.  Cardiovascular: Regular rhythm and normal heart sounds.  Tachycardia present.  Exam reveals no friction rub.   No murmur heard. Pulmonary/Chest: Effort normal. No respiratory distress. She  has no wheezes. She has rhonchi in the right lower field and the left lower field. She has no rales.  Abdominal: Soft. Bowel sounds are normal. She exhibits no distension. There is no tenderness.  Musculoskeletal: She exhibits no edema.  Neurological: She is alert and oriented to person, place, and time. She exhibits normal muscle tone.  Skin: Skin is dry. Capillary refill takes 2 to 3 seconds.  Psychiatric: She has a normal mood and affect.  Nursing note and vitals reviewed.    ED Treatments / Results  Labs (all labs ordered are listed, but only abnormal results are  displayed) Labs Reviewed  COMPREHENSIVE METABOLIC PANEL - Abnormal; Notable for the following:       Result Value   Potassium 3.4 (*)    CO2 19 (*)    BUN 21 (*)    Creatinine, Ser 1.46 (*)    Alkaline Phosphatase 146 (*)    GFR calc non Af Amer 36 (*)    GFR calc Af Amer 41 (*)    All other components within normal limits  CBC WITH DIFFERENTIAL/PLATELET - Abnormal; Notable for the following:    WBC 2.4 (*)    Lymphs Abs 0.4 (*)    Monocytes Absolute 0.0 (*)    All other components within normal limits  URINALYSIS, ROUTINE W REFLEX MICROSCOPIC - Abnormal; Notable for the following:    Color, Urine AMBER (*)    APPearance HAZY (*)    Hgb urine dipstick MODERATE (*)    Protein, ur 100 (*)    Leukocytes, UA LARGE (*)    Bacteria, UA RARE (*)    Squamous Epithelial / LPF 0-5 (*)    All other components within normal limits  LACTIC ACID, PLASMA - Abnormal; Notable for the following:    Lactic Acid, Venous 2.0 (*)    All other components within normal limits  TROPONIN I - Abnormal; Notable for the following:    Troponin I 0.06 (*)    All other components within normal limits  PROTIME-INR - Abnormal; Notable for the following:    Prothrombin Time 17.4 (*)    All other components within normal limits  APTT - Abnormal; Notable for the following:    aPTT 41 (*)    All other components within normal limits  CBC - Abnormal; Notable for the following:    WBC 18.5 (*)    Hemoglobin 11.9 (*)    All other components within normal limits  CREATININE, SERUM - Abnormal; Notable for the following:    Creatinine, Ser 1.21 (*)    GFR calc non Af Amer 45 (*)    GFR calc Af Amer 52 (*)    All other components within normal limits  AMYLASE - Abnormal; Notable for the following:    Amylase 25 (*)    All other components within normal limits  I-STAT CG4 LACTIC ACID, ED - Abnormal; Notable for the following:    Lactic Acid, Venous 4.72 (*)    All other components within normal limits  I-STAT  CG4 LACTIC ACID, ED - Abnormal; Notable for the following:    Lactic Acid, Venous 2.79 (*)    All other components within normal limits  CULTURE, BLOOD (ROUTINE X 2)  CULTURE, BLOOD (ROUTINE X 2)  URINE CULTURE  RESPIRATORY PANEL BY PCR  MRSA PCR SCREENING  GASTROINTESTINAL PANEL BY PCR, STOOL (REPLACES STOOL CULTURE)  C DIFFICILE QUICK SCREEN W PCR REFLEX  STOOL CULTURE  LIPASE, BLOOD  INFLUENZA PANEL  BY PCR (TYPE A & B)  TSH  CORTISOL  PROCALCITONIN  STREP PNEUMONIAE URINARY ANTIGEN  TSH  COOXEMETRY PANEL  CORTISOL  LEGIONELLA PNEUMOPHILA SEROGP 1 UR AG  T4, FREE  PROCALCITONIN  COMPREHENSIVE METABOLIC PANEL  MAGNESIUM  CBC WITH DIFFERENTIAL/PLATELET  PHOSPHORUS  I-STAT TROPONIN, ED  TYPE AND SCREEN  ABO/RH    EKG  EKG Interpretation None       Radiology Dg Chest 1 View  Result Date: 09/30/2017 CLINICAL DATA:  Central line placement EXAM: CHEST 1 VIEW COMPARISON:  Chest x-ray of earlier today. FINDINGS: There has been placement of a right internal jugular venous catheter. The tip projects over the midportion of the SVC. The proximal portion of the catheter appears coiled either outside of or under the skin. There is no pneumothorax or pleural effusion. There is mild soft tissue fullness in the right paratracheal region. The left lung is clear. The heart and pulmonary vascularity are normal. IMPRESSION: No immediate postprocedure complication following right internal jugular venous catheter placement. Electronically Signed   By: David  [place] M.D.   On: 09/30/2017 16:05   US Renal  Result Date: 09/30/2017 CLINICAL DATA:  Acute renal failure. EXAM: RENAL / URINARY TRACT ULTRASOUND COMPLETE COMPARISON:  None. FINDINGS: Right Kidney: Length: 11.4 cm. Echogenicity within normal limits. No mass or hydronephrosis visualized. Left Kidney: Length: 11.3 cm. Echogenicity within normal limits. No mass or hydronephrosis visualized. Bladder: Appears normal for degree of bladder  distention. IMPRESSION: Normal size and appearance of both kidneys. No evidence of hydronephrosis. Electronically Signed   By: Myles Rosenthal M.D.   On: 09/30/2017 18:01   Dg Chest Port 1 View  Result Date: 09/30/2017 CLINICAL DATA:  Cough, weakness, chills, and nausea. Smoking history. EXAM: PORTABLE CHEST 1 VIEW COMPARISON:  None. FINDINGS: The cardiomediastinal silhouette is within normal limits. There is minimal central airway thickening. No confluent airspace opacity, edema, pleural effusion, or pneumothorax is identified. No acute osseous abnormality is seen. IMPRESSION: Minimal bronchitic changes. Electronically Signed   By: Sebastian Ache M.D.   On: 09/30/2017 09:23    Procedures Procedures (including critical care time)  Medications Ordered in ED Medications  iopamidol (ISOVUE-300) 61 % injection (  Canceled Entry 09/30/17 1300)  levothyroxine (SYNTHROID, LEVOTHROID) tablet 75 mcg (not administered)  0.9 %  sodium chloride infusion (not administered)  heparin injection 5,000 Units (not administered)  dextrose 5 % in lactated ringers infusion ( Intravenous Rate/Dose Verify 09/30/17 1500)  0.9 %  sodium chloride infusion (not administered)  norepinephrine (LEVOPHED) 4 mg in dextrose 5 % 250 mL (0.016 mg/mL) infusion (8 mcg/min Intravenous Rate/Dose Change 09/30/17 1725)  oseltamivir (TAMIFLU) capsule 75 mg (0 mg Oral Hold 09/30/17 1400)  vancomycin (VANCOCIN) IVPB 1000 mg/200 mL premix (not administered)  ceFEPIme (MAXIPIME) 1 g in dextrose 5 % 50 mL IVPB (not administered)  levofloxacin (LEVAQUIN) IVPB 750 mg (not administered)  potassium chloride 10 mEq in 100 mL IVPB (0 mEq Intravenous Stopped 09/30/17 2059)  Chlorhexidine Gluconate Cloth 2 % PADS 6 each (not administered)  sodium chloride flush (NS) 0.9 % injection 10-40 mL (not administered)  sodium chloride flush (NS) 0.9 % injection 10-40 mL (not administered)  Chlorhexidine Gluconate Cloth 2 % PADS 6 each (0 each Topical Duplicate  09/30/17 1630)  metroNIDAZOLE (FLAGYL) IVPB 500 mg (0 mg Intravenous Stopped 09/30/17 2059)  guaiFENesin-dextromethorphan (ROBITUSSIN DM) 100-10 MG/5ML syrup 5 mL (5 mLs Oral Given 09/30/17 1958)  ondansetron (ZOFRAN) injection 4 mg (4 mg Intravenous Given 09/30/17  1958)  sodium chloride 0.9 % bolus 1,000 mL (0 mLs Intravenous Stopped 09/30/17 0930)    And  sodium chloride 0.9 % bolus 1,000 mL (0 mLs Intravenous Stopped 09/30/17 0931)    And  sodium chloride 0.9 % bolus 250 mL (0 mLs Intravenous Stopped 09/30/17 0937)  ceFEPIme (MAXIPIME) 2 g in dextrose 5 % 50 mL IVPB (0 g Intravenous Stopped 09/30/17 0931)  metroNIDAZOLE (FLAGYL) IVPB 500 mg (0 mg Intravenous Stopped 09/30/17 1150)  lactated ringers bolus 1,000 mL (0 mLs Intravenous Stopped 09/30/17 1249)  vancomycin (VANCOCIN) IVPB 1000 mg/200 mL premix (0 mg Intravenous Stopped 09/30/17 1151)  norepinephrine (LEVOPHED) 4 mg in dextrose 5 % 250 mL (0.016 mg/mL) infusion (5 mcg/min Intravenous Rate/Dose Change 09/30/17 1342)  lactated ringers bolus 1,000 mL (0 mLs Intravenous Stopped 09/30/17 1350)  levofloxacin (LEVAQUIN) IVPB 750 mg (0 mg Intravenous Stopped 09/30/17 1843)     Initial Impression / Assessment and Plan / ED Course  I have reviewed the triage vital signs and the nursing notes.  Pertinent labs & imaging results that were available during my care of the patient were reviewed by me and considered in my medical decision making (see chart for details).     67 yo F here with generalized fatigue, chills. Vitals on arrival remarkable for hypotension, tachycardia, and tachypnea. Lab work shows marked lactic acidosis, AKI, and leukopenia. Concern for severe sepsis with early septic shock. Cortisol level sent, code sepsis initiated and pt started on broad-spectrum ABX, with >30 cc/kg fluid resuscitation. No abdominal pain, TTP but will add flagyl for intra-abd coverage, CT as well. No apparent PNA. No skin rashes or breakdown. No HA or signs of  meningitis or encepahlitis.   Pt persistently hypotensive despite fluid resusc. Will start levophed gtt, admit to ICU.  Final Clinical Impressions(s) / ED Diagnoses   Final diagnoses:  Septic shock (HCC)  AKI (acute kidney injury) (HCC)  Lactic acidosis    New Prescriptions Current Discharge Medication List       Shaune Pollack, MD 09/30/17 2117

## 2017-09-30 NOTE — ED Notes (Signed)
Cindee Lame, NP aware of BP.

## 2017-09-30 NOTE — Progress Notes (Signed)
CRITICAL VALUE ALERT  Critical Value:  Lactic Acid 2.0, Troponin 0.06, WBC bump from 2.4 up to 18.5  Date & Time Notied:  1800 09/30/17  Provider Notified: Dr. Jamison Neighbor  Orders Received/Actions taken: Lactic acid is trending down, continue to monitor. WBC will recheck in AM.  12 lead preformed will monitor.

## 2017-09-30 NOTE — Progress Notes (Signed)
Pharmacy Antibiotic Note  Tracey Johnson is a 67 y.o. female admitted on 09/30/2017 with sepsis.  Pharmacy has been consulted for Vanc/cefepime/Levaquin dosing.  Plan: Vancomycin 1g IV every 24 hours.  Goal trough 15-20 mcg/mL. The dose of cefepime will be adjusted to 1g q24 based on renal function. Levaquin  IV q48 based on current renal function  Weight: 155 lb (70.3 kg)  Temp (24hrs), Avg:98.7 F (37.1 C), Min:97.6 F (36.4 C), Max:99.8 F (37.7 C)   Recent Labs Lab 09/30/17 0822 09/30/17 0908 09/30/17 1306  WBC 2.4*  --   --   CREATININE 1.46*  --   --   LATICACIDVEN  --  4.72* 2.79*    CrCl cannot be calculated (Unknown ideal weight.).    Allergies  Allergen Reactions  . Erythromycin Rash    "most antibiotics"  . Penicillins Rash    "Most antibiotics" Has patient had a PCN reaction causing immediate rash, facial/tongue/throat swelling, SOB or lightheadedness with hypotension: Yes Has patient had a PCN reaction causing severe rash involving mucus membranes or skin necrosis: yes Has patient had a PCN reaction that required hospitalization:No Has patient had a PCN reaction occurring within the last 10 years:No If all of the above answers are "NO", then may proceed with Cephalosporin use.   . Sulfa Antibiotics Rash    "most antibiotics"     Thank you for allowing pharmacy to be a part of this patient's care.   Hessie Knows, PharmD, BCPS Pager [phone] 09/30/2017 1:52 PM

## 2017-09-30 NOTE — ED Notes (Signed)
Blood cultures drawn prior to starting antibiotics  

## 2017-10-01 ENCOUNTER — Encounter: Payer: Self-pay | Admitting: Pulmonary Disease

## 2017-10-01 ENCOUNTER — Inpatient Hospital Stay (HOSPITAL_COMMUNITY): Payer: Medicare Other

## 2017-10-01 DIAGNOSIS — I340 Nonrheumatic mitral (valve) insufficiency: Secondary | ICD-10-CM

## 2017-10-01 DIAGNOSIS — A419 Sepsis, unspecified organism: Secondary | ICD-10-CM

## 2017-10-01 DIAGNOSIS — R6521 Severe sepsis with septic shock: Secondary | ICD-10-CM

## 2017-10-01 LAB — ECHOCARDIOGRAM COMPLETE
HEIGHTINCHES: 65 in
Weight: 2712.54 oz

## 2017-10-01 LAB — CBC WITH DIFFERENTIAL/PLATELET
BASOS PCT: 0 %
Basophils Absolute: 0 10*3/uL (ref 0.0–0.1)
EOS PCT: 0 %
Eosinophils Absolute: 0 10*3/uL (ref 0.0–0.7)
HEMATOCRIT: 35.5 % — AB (ref 36.0–46.0)
Hemoglobin: 11.7 g/dL — ABNORMAL LOW (ref 12.0–15.0)
LYMPHS ABS: 2 10*3/uL (ref 0.7–4.0)
Lymphocytes Relative: 6 %
MCH: 29.3 pg (ref 26.0–34.0)
MCHC: 33 g/dL (ref 30.0–36.0)
MCV: 89 fL (ref 78.0–100.0)
MONO ABS: 1.3 10*3/uL — AB (ref 0.1–1.0)
MONOS PCT: 4 %
NEUTROS ABS: 29.7 10*3/uL — AB (ref 1.7–7.7)
Neutrophils Relative %: 90 %
Platelets: 165 10*3/uL (ref 150–400)
RBC: 3.99 MIL/uL (ref 3.87–5.11)
RDW: 14.1 % (ref 11.5–15.5)
WBC: 33 10*3/uL — ABNORMAL HIGH (ref 4.0–10.5)

## 2017-10-01 LAB — GASTROINTESTINAL PANEL BY PCR, STOOL (REPLACES STOOL CULTURE)

## 2017-10-01 LAB — RENAL FUNCTION PANEL
ALBUMIN: 2.9 g/dL — AB (ref 3.5–5.0)
Anion gap: 7 (ref 5–15)
BUN: 12 mg/dL (ref 6–20)
CALCIUM: 8.2 mg/dL — AB (ref 8.9–10.3)
CO2: 21 mmol/L — ABNORMAL LOW (ref 22–32)
Chloride: 113 mmol/L — ABNORMAL HIGH (ref 101–111)
Creatinine, Ser: 1.05 mg/dL — ABNORMAL HIGH (ref 0.44–1.00)
GFR calc Af Amer: >60 mL/min (ref >60)
GFR calc non Af Amer: 53 mL/min — ABNORMAL LOW (ref >60)
GLUCOSE: 188 mg/dL — AB (ref 65–99)
PHOSPHORUS: 1.9 mg/dL — AB (ref 2.5–4.6)
POTASSIUM: 3.4 mmol/L — AB (ref 3.5–5.1)
SODIUM: 141 mmol/L (ref 135–145)

## 2017-10-01 LAB — COMPREHENSIVE METABOLIC PANEL
ALK PHOS: 85 U/L (ref 38–126)
ALT: 26 U/L (ref 14–54)
AST: 30 U/L (ref 15–41)
Albumin: 2.8 g/dL — ABNORMAL LOW (ref 3.5–5.0)
Anion gap: 8 (ref 5–15)
BUN: 16 mg/dL (ref 6–20)
CALCIUM: 8.2 mg/dL — AB (ref 8.9–10.3)
CHLORIDE: 114 mmol/L — AB (ref 101–111)
CO2: 19 mmol/L — AB (ref 22–32)
CREATININE: 1.14 mg/dL — AB (ref 0.44–1.00)
GFR calc Af Amer: 56 mL/min — ABNORMAL LOW (ref >60)
GFR, EST NON AFRICAN AMERICAN: 48 mL/min — AB (ref >60)
Glucose, Bld: 194 mg/dL — ABNORMAL HIGH (ref 65–99)
Potassium: 4 mmol/L (ref 3.5–5.1)
SODIUM: 141 mmol/L (ref 135–145)
Total Bilirubin: 0.9 mg/dL (ref 0.3–1.2)
Total Protein: 5.6 g/dL — ABNORMAL LOW (ref 6.5–8.1)

## 2017-10-01 LAB — RESPIRATORY PANEL BY PCR
Adenovirus: NOT DETECTED
BORDETELLA PERTUSSIS-RVPCR: NOT DETECTED
CHLAMYDOPHILA PNEUMONIAE-RVPPCR: NOT DETECTED
CORONAVIRUS 229E-RVPPCR: NOT DETECTED
CORONAVIRUS HKU1-RVPPCR: NOT DETECTED
CORONAVIRUS NL63-RVPPCR: NOT DETECTED
Coronavirus OC43: NOT DETECTED
Influenza A: NOT DETECTED
Influenza B: NOT DETECTED
METAPNEUMOVIRUS-RVPPCR: NOT DETECTED
MYCOPLASMA PNEUMONIAE-RVPPCR: NOT DETECTED
PARAINFLUENZA VIRUS 1-RVPPCR: NOT DETECTED
Parainfluenza Virus 2: NOT DETECTED
Parainfluenza Virus 3: NOT DETECTED
Parainfluenza Virus 4: NOT DETECTED
RESPIRATORY SYNCYTIAL VIRUS-RVPPCR: NOT DETECTED
RHINOVIRUS / ENTEROVIRUS - RVPPCR: NOT DETECTED

## 2017-10-01 LAB — URINE CULTURE
CULTURE: NO GROWTH
Special Requests: NORMAL

## 2017-10-01 LAB — DIC (DISSEMINATED INTRAVASCULAR COAGULATION) PANEL
APTT: 51 s — AB (ref 24–36)
PLATELETS: 154 10*3/uL (ref 150–400)
SMEAR REVIEW: NONE SEEN

## 2017-10-01 LAB — PROCALCITONIN: PROCALCITONIN: 20.88 ng/mL

## 2017-10-01 LAB — LEGIONELLA PNEUMOPHILA SEROGP 1 UR AG: L. PNEUMOPHILA SEROGP 1 UR AG: NEGATIVE

## 2017-10-01 LAB — DIC (DISSEMINATED INTRAVASCULAR COAGULATION)PANEL
D-Dimer, Quant: 9.03 ug/mL-FEU — ABNORMAL HIGH (ref 0.00–0.50)
Fibrinogen: 479 mg/dL — ABNORMAL HIGH (ref 210–475)
INR: 1.73
Prothrombin Time: 20.1 seconds — ABNORMAL HIGH (ref 11.4–15.2)

## 2017-10-01 LAB — MAGNESIUM
Magnesium: 1.4 mg/dL — ABNORMAL LOW (ref 1.7–2.4)
Magnesium: 2.1 mg/dL (ref 1.7–2.4)

## 2017-10-01 LAB — PHOSPHORUS: Phosphorus: 1.6 mg/dL — ABNORMAL LOW (ref 2.5–4.6)

## 2017-10-01 LAB — CLOSTRIDIUM DIFFICILE BY PCR: Toxigenic C. Difficile by PCR: POSITIVE — AB

## 2017-10-01 MED ORDER — ACETAMINOPHEN 325 MG PO TABS
650.0000 mg | ORAL_TABLET | ORAL | Status: DC | PRN
  Administered 2017-10-01 (×2): 650 mg via ORAL
  Filled 2017-10-01 (×2): qty 2

## 2017-10-01 MED ORDER — MAGNESIUM SULFATE 50 % IJ SOLN
3.0000 g | Freq: Once | INTRAVENOUS | Status: AC
  Administered 2017-10-01: 3 g via INTRAVENOUS
  Filled 2017-10-01: qty 6

## 2017-10-01 MED ORDER — POTASSIUM PHOSPHATES 15 MMOLE/5ML IV SOLN
20.0000 mmol | Freq: Once | INTRAVENOUS | Status: AC
  Administered 2017-10-01: 20 mmol via INTRAVENOUS
  Filled 2017-10-01: qty 6.67

## 2017-10-01 MED ORDER — SODIUM PHOSPHATES 45 MMOLE/15ML IV SOLN
20.0000 mmol | Freq: Once | INTRAVENOUS | Status: AC
  Administered 2017-10-01: 20 mmol via INTRAVENOUS
  Filled 2017-10-01: qty 6.67

## 2017-10-01 MED ORDER — VANCOMYCIN HCL IN DEXTROSE 750-5 MG/150ML-% IV SOLN
750.0000 mg | Freq: Two times a day (BID) | INTRAVENOUS | Status: DC
  Administered 2017-10-01 – 2017-10-02 (×3): 750 mg via INTRAVENOUS
  Filled 2017-10-01 (×3): qty 150

## 2017-10-01 MED ORDER — VANCOMYCIN 50 MG/ML ORAL SOLUTION
125.0000 mg | Freq: Four times a day (QID) | ORAL | Status: DC
  Administered 2017-10-01 – 2017-10-05 (×15): 125 mg via ORAL
  Filled 2017-10-01 (×17): qty 2.5

## 2017-10-01 MED ORDER — DEXTROSE 5 % IV SOLN
1.0000 g | Freq: Two times a day (BID) | INTRAVENOUS | Status: DC
  Administered 2017-10-01: 1 g via INTRAVENOUS
  Filled 2017-10-01 (×2): qty 1

## 2017-10-01 MED ORDER — OSELTAMIVIR PHOSPHATE 30 MG PO CAPS
30.0000 mg | ORAL_CAPSULE | Freq: Two times a day (BID) | ORAL | Status: DC
  Filled 2017-10-01: qty 1

## 2017-10-01 MED ORDER — OXYCODONE HCL 5 MG PO TABS
5.0000 mg | ORAL_TABLET | Freq: Four times a day (QID) | ORAL | Status: DC | PRN
  Administered 2017-10-01 – 2017-10-05 (×11): 5 mg via ORAL
  Filled 2017-10-01 (×12): qty 1

## 2017-10-01 NOTE — Care Management Note (Signed)
Case Management Note  Patient Details  Name: Tracey Johnson MRN: [id number] Date of Birth: 27-Jan-1950  Subjective/Objective:                  IV Levophed drip.  Hypotension and decreased urinary outpt  Action/Plan: Date:  October 01, 2017 Chart reviewed for concurrent status and case management needs.  Will continue to follow patient progress.  Discharge Planning: following for needs  Expected discharge date: October 04, 2017  Marcelle Smiling, BSN, [place], [place]   [phone]   Expected Discharge Date:   (unknown)               Expected Discharge Plan:  Home/Self Care  In-House Referral:     Discharge planning Services  CM Consult  Post Acute Care Choice:    Choice offered to:     DME Arranged:    DME Agency:     HH Arranged:    HH Agency:     Status of Service:  In process, will continue to follow  If discussed at Long Length of Stay Meetings, dates discussed:    Additional Comments:  Golda Acre, RN 10/01/2017, 9:07 AM

## 2017-10-01 NOTE — Progress Notes (Signed)
  Echocardiogram 2D Echocardiogram has been performed.  Audrea Bolte L Androw 10/01/2017, 1:51 PM

## 2017-10-01 NOTE — Progress Notes (Signed)
PCCM Attending Re-Rounding Note:  Further studies reviewed. Patient does have mild hypokalemia and hypophosphatemia with correction of hypomagnesemia from this morning. Echocardiogram without focal wall motion abnormality to suggest myocardial infarction and preserved right ventricular function. Gastrointestinal PCR panel negative & respiratory viral PCR panel negative. Therefore, droplet isolation discontinued along with Tamiflu and Levaquin. C. difficile PCR positive. Patient started on oral vancomycin earlier this afternoon by myself.  1. Discontinuing droplet isolation 2. Discontinuing cefepime 3. Already discontinued Levaquin and Tamiflu 4. Continuing enteric precautions 5. Continuing IV Flagyl, IV vancomycin, and oral vancomycin 6. Awaiting blood culture results  Marlyne Beards E. Jamison Neighbor, M.D. [facility] Pager:  [phone] After 7pm or if no response, call [phone] 5:37 PM 10/01/17

## 2017-10-01 NOTE — Progress Notes (Signed)
eLink Physician-Brief Progress Note Patient Name: Tracey Johnson DOB: 1950-08-19 MRN: [id number]   Date of Service  10/01/2017  HPI/Events of Note  headache  eICU Interventions  APAP prn mild pain/headache Oxycodone  q6h prn severe/breakthrough pain     Intervention Category Intermediate Interventions: Pain - evaluation and management  Max Fickle 10/01/2017, 2:12 AM

## 2017-10-01 NOTE — Plan of Care (Signed)
  Interdisciplinary Goals of Care Family Meeting   Date carried out:: 10/01/2017  Location of the meeting: Unit  Member's involved: Physician, Bedside Registered Nurse and Family Member or next of kin  Durable Power of Attorney or acting medical decision maker: Husband / Tracey Johnson    Discussion: We discussed goals of care for Tracey Johnson .  Spoke with husband at length regarding her septic shock and projected plan of care. Explained the need for continued ICU care in the setting of vasopressor titration. Also explained patient would not likely be able to safely travel for her upcoming meeting in the [place] this weekend.   Code status: Full Code  Disposition: Continue current acute care  Time spent for the meeting: 11 minutes   Tracey Johnson 10/01/2017, 7:08 PM

## 2017-10-01 NOTE — Progress Notes (Signed)
PULMONARY / CRITICAL CARE MEDICINE   Name: Tracey Johnson MRN: [id number] DOB: [birth year]    ADMISSION DATE:  09/30/2017   CONSULTATION DATE:  09/30/2017  REFERRING MD:  Shaune Pollack, M.D. / EDP  CHIEF COMPLAINT:  Septic Shock  HISTORY OF PRESENT ILLNESS:  67 y.o.  female presenting with relatively acute onset of hot and cold flashes with chills and rigors today. Endorse some nausea with vomiting but no diarrhea. Denies any dysuria or hematuria. Denies any recent sick contacts. Reports she did eat a salad at [facility] for dinner last night.  denies consumption of any raw or undercooked foods in the last 24 hours. Endorses some mild cough with questionable dyspnea. No rashes or abnormal bruising. No chest pain or pressure. Patient was found to be hypotensive and leukopenic in the emergency room with elevated lactic acid consistent with sepsis. Bolus IV fluids were administered along with broad-spectrum antibiotics including vancomycin, cefepime, and Flagyl. Critical care medicine was consulted given ongoing shock and potential for further clinical worsening.   SUBJECTIVE:  Patient did have nausea overnight as well as significant copious watery diarrhea. Remains on vasopressor support. Minimal cough. Minimal dyspnea. No abdominal pain. Nausea has improved somewhat.  REVIEW OF SYSTEMS:  Denies any subjective fever or chills. No chest pain or pressure.  VITAL SIGNS: BP (!) 142/86 (BP Location: Right Arm)   Pulse 77   Temp 97.9 F (36.6 C) (Oral)   Resp 18   Ht  (1.651 m)   Wt 169 lb 8.5 oz (76.9 kg)   SpO2 96%   BMI 28.21 kg/m   HEMODYNAMICS: CVP:  [11 mmHg] 11 mmHg  VENTILATOR SETTINGS:    INTAKE / OUTPUT: I/O last 3 completed shifts: In: 3789.7 [I.V.:2439.7; IV Piggyback:1350] Out: 1300 [Urine:1300]  PHYSICAL EXAMINATION: General:  Awake. Alert. No acute distress. Nurse at bedside.  Integument:  Warm & dry. No rash on exposed skin. No bruising on exposed  skin. Extremities:  No cyanosis or clubbing.  HEENT:  Tacky mucus membranes. No scleral injection or icterus.  Cardiovascular:  Regular rate. No edema. No appreciable JVD.  Pulmonary:  Good aeration & clear to auscultation bilaterally. Symmetric chest wall expansion. No accessory muscle use on nasal cannula. Abdomen: Soft. Normal bowel sounds. Protuberant. Grossly nontender. Neurological:  Cranial nerves 2-12 grossly in tact. Alert and oriented 4. No meningismus.  LABS:  BMET  Recent Labs Lab 09/30/17 0822 09/30/17 1650 10/01/17 0539  NA 143  --  141  K 3.4*  --  4.0  CL 110  --  114*  CO2 19*  --  19*  BUN 21*  --  16  CREATININE 1.46* 1.21* 1.14*  GLUCOSE 94  --  194*    Electrolytes  Recent Labs Lab 09/30/17 0822 10/01/17 0539  CALCIUM 9.6 8.2*  MG  --  1.4*  PHOS  --  1.6*    CBC  Recent Labs Lab 09/30/17 0822 09/30/17 1650 10/01/17 0539  WBC 2.4* 18.5* 33.0*  HGB 15.0 11.9* 11.7*  HCT 44.7 36.1 35.5*  PLT 189 168 165    Coag's  Recent Labs Lab 09/30/17 1650  APTT 41*  INR 1.44    Sepsis Markers  Recent Labs Lab 09/30/17 0908 09/30/17 1306 09/30/17 1650 10/01/17 0539  LATICACIDVEN 4.72* 2.79* 2.0*  --   PROCALCITON  --   --  38.77 20.88    ABG No results for input(s): PHART, PCO2ART, PO2ART in the last 168 hours.  Liver Enzymes  Recent  Labs Lab 09/30/17 0822 10/01/17 0539  AST 40 30  ALT 30 26  ALKPHOS 146* 85  BILITOT 1.1 0.9  ALBUMIN 3.7 2.8*    Cardiac Enzymes  Recent Labs Lab 09/30/17 1650  TROPONINI 0.06*    Glucose No results for input(s): GLUCAP in the last 168 hours.  Imaging Dg Chest 1 View  Result Date: 09/30/2017 CLINICAL DATA:  Central line placement EXAM: CHEST 1 VIEW COMPARISON:  Chest x-ray of earlier today. FINDINGS: There has been placement of a right internal jugular venous catheter. The tip projects over the midportion of the SVC. The proximal portion of the catheter appears coiled either  outside of or under the skin. There is no pneumothorax or pleural effusion. There is mild soft tissue fullness in the right paratracheal region. The left lung is clear. The heart and pulmonary vascularity are normal. IMPRESSION: No immediate postprocedure complication following right internal jugular venous catheter placement. Electronically Signed   By: David  [place] M.D.   On: 09/30/2017 16:05   US Renal  Result Date: 09/30/2017 CLINICAL DATA:  Acute renal failure. EXAM: RENAL / URINARY TRACT ULTRASOUND COMPLETE COMPARISON:  None. FINDINGS: Right Kidney: Length: 11.4 cm. Echogenicity within normal limits. No mass or hydronephrosis visualized. Left Kidney: Length: 11.3 cm. Echogenicity within normal limits. No mass or hydronephrosis visualized. Bladder: Appears normal for degree of bladder distention. IMPRESSION: Normal size and appearance of both kidneys. No evidence of hydronephrosis. Electronically Signed   By: Myles Rosenthal M.D.   On: 09/30/2017 18:01   Dg Chest Port 1 View  Result Date: 09/30/2017 CLINICAL DATA:  Cough, weakness, chills, and nausea. Smoking history. EXAM: PORTABLE CHEST 1 VIEW COMPARISON:  None. FINDINGS: The cardiomediastinal silhouette is within normal limits. There is minimal central airway thickening. No confluent airspace opacity, edema, pleural effusion, or pneumothorax is identified. No acute osseous abnormality is seen. IMPRESSION: Minimal bronchitic changes. Electronically Signed   By: Sebastian Ache M.D.   On: 09/30/2017 09:23    STUDIES:  PORT CXR 10/1:  Previously reviewed by me.  questionable increased interstitial markings particularly in the right base. No focal opacity or consolidation appreciated. No pleural effusion appreciated. Heart and mediastinal contours otherwise normal. RENAL U/S 10/1:  Normal size and appearance of both kidneys. No evidence of hydronephrosis.  MICROBIOLOGY: MRSA PCR 10/1 >>> Blood Cultures x2 10/1 >>> Urine Culture 10/1 >>> Respiratory  Viral Panel PCR 10/1 >>> Influenza A/B PCR 10/1:  Negative  Urine Streptococcal Antigen 10/1:  Negative  Urine Legionella Antigen 10/1 >>> Gastrointestinal PCR Panel 10/1 >>> Stool C diff PCR 10/1 >>> (toxin neg & antigen pos)  ANTIBIOTICS: Flagyl 10/1 >>> Vancomycin 10/01 >>> Cefepime 10/01 >>> Levaquin 10/01 >>> Tamiflu 10/01 >>>  SIGNIFICANT EVENTS: 10/01 - Admit  LINES/TUBES: R IJ CVL 10/1 >>>  ASSESSMENT / PLAN:  67 y.o. female with septic shock and acute renal failure. Question viral prodrome versus infectious gastroenteritis. Acute renal failure resolving. Acidosis improving. Awaiting results of infectious workup above.  1. Septic shock: Awaiting results of infectious workup above. Continuing to wean vasopressor support. Trending Procalcitonin per algorithm. 2. Acute renal failure: Resolving. Maintaining mean arterial pressure greater than 65. Continuing lactated Ringer's IV fluid. 3. Nausea/vomiting/diarrhea: Zofran IV when necessary. Awaiting results of infectious workup above. Continuing Flagyl IV. 4. Coagulopathy: Mild. Suspect some element of DIC. Checking DIC panel. 5. Hypomagnesemia: Replacing with magnesium sulfate IV. Repeat magnesium level at 5 PM. 6. Hypophosphatemia: Replaced with sodium phosphorus IV. Repeat electrolyte panel this afternoon.  7. Hypokalemia: Resolved. 8. Hypothyroidism: Continuing Synthroid.  Prophylaxis: SCDs & heparin subcutaneous every 8 hours. Diet: Starting clear liquid diet.  Code Status: Full code per my discussion with patient today. Disposition: Remains critically ill in the ICU. Family Update: Patient updated at bedside at the time of my exam.  I have spent a total of 32 minutes of critical care time today caring for the patient and reviewing the patient's electronic medical record.   Donna Christen Jamison Neighbor, M.D. [facility] Pulmonary & Critical Care Pager:  [phone] After 7pm or if no response, call [phone] 10/01/2017, 8:12 AM

## 2017-10-02 ENCOUNTER — Inpatient Hospital Stay (HOSPITAL_COMMUNITY): Payer: Medicare Other

## 2017-10-02 LAB — CBC WITH DIFFERENTIAL/PLATELET
BASOS PCT: 0 %
Basophils Absolute: 0 10*3/uL (ref 0.0–0.1)
EOS ABS: 0 10*3/uL (ref 0.0–0.7)
Eosinophils Relative: 0 %
HCT: 35.2 % — ABNORMAL LOW (ref 36.0–46.0)
Hemoglobin: 11.3 g/dL — ABNORMAL LOW (ref 12.0–15.0)
LYMPHS ABS: 3.1 10*3/uL (ref 0.7–4.0)
Lymphocytes Relative: 12 %
MCH: 28.9 pg (ref 26.0–34.0)
MCHC: 32.1 g/dL (ref 30.0–36.0)
MCV: 90 fL (ref 78.0–100.0)
MONO ABS: 0.8 10*3/uL (ref 0.1–1.0)
Monocytes Relative: 3 %
NEUTROS PCT: 85 %
Neutro Abs: 22.2 10*3/uL — ABNORMAL HIGH (ref 1.7–7.7)
PLATELETS: 133 10*3/uL — AB (ref 150–400)
RBC: 3.91 MIL/uL (ref 3.87–5.11)
RDW: 14.7 % (ref 11.5–15.5)
WBC: 26.1 10*3/uL — AB (ref 4.0–10.5)

## 2017-10-02 LAB — RENAL FUNCTION PANEL
ALBUMIN: 2.7 g/dL — AB (ref 3.5–5.0)
Anion gap: 6 (ref 5–15)
BUN: 9 mg/dL (ref 6–20)
CHLORIDE: 114 mmol/L — AB (ref 101–111)
CO2: 23 mmol/L (ref 22–32)
Calcium: 8 mg/dL — ABNORMAL LOW (ref 8.9–10.3)
Creatinine, Ser: 1.05 mg/dL — ABNORMAL HIGH (ref 0.44–1.00)
GFR calc Af Amer: >60 mL/min (ref >60)
GFR calc non Af Amer: 53 mL/min — ABNORMAL LOW (ref >60)
GLUCOSE: 101 mg/dL — AB (ref 65–99)
PHOSPHORUS: 2.3 mg/dL — AB (ref 2.5–4.6)
Potassium: 3.5 mmol/L (ref 3.5–5.1)
Sodium: 143 mmol/L (ref 135–145)

## 2017-10-02 LAB — PROTIME-INR
INR: 1.17
PROTHROMBIN TIME: 14.8 s (ref 11.4–15.2)

## 2017-10-02 LAB — MAGNESIUM: Magnesium: 1.8 mg/dL (ref 1.7–2.4)

## 2017-10-02 LAB — APTT: aPTT: 42 seconds — ABNORMAL HIGH (ref 24–36)

## 2017-10-02 LAB — PROCALCITONIN: Procalcitonin: 7.85 ng/mL

## 2017-10-02 MED ORDER — SIMETHICONE 80 MG PO CHEW
80.0000 mg | CHEWABLE_TABLET | Freq: Once | ORAL | Status: AC
  Administered 2017-10-02: 80 mg via ORAL
  Filled 2017-10-02: qty 1

## 2017-10-02 MED ORDER — IOPAMIDOL (ISOVUE-300) INJECTION 61%
INTRAVENOUS | Status: AC
  Filled 2017-10-02: qty 30

## 2017-10-02 NOTE — Progress Notes (Signed)
PCCM Attending Note:  Called to bedside by nurse. Patient reports she had some lunch around noon then had 2 bowel movements afterward. Shortly after 3pm she reports acute onset of a "deep" pain, particularly in her left side. She denies any new nausea. Vital signs stable when reviewed with RN. Patient appears comfortable & laying on right side. No distress. Awake & alert. Abdomen protuberant & soft. Hypoactive bowel sounds. No guarding. Some tenderness, primarily on her left side, to moderate palpation with tenderness on percussion as well. Patient given Simethicone at my request without relief. Checking Stat CT Abdomen & Pelvis without contrast. Plan of care discussed with bedside nurse at the time of this note.   Donna Christen Jamison Neighbor, M.D. [facility] Pulmonary & Critical Care Pager:  [phone] After 7pm or if no response, call [phone] 5:09 PM 10/02/17

## 2017-10-02 NOTE — Progress Notes (Addendum)
PULMONARY / CRITICAL CARE MEDICINE   Name: Tracey Johnson MRN: [id number] DOB: [birth year]    ADMISSION DATE:  09/30/2017   CONSULTATION DATE:  09/30/2017  REFERRING MD:  Shaune Pollack, M.D. / EDP  CHIEF COMPLAINT:  Septic Shock  HISTORY OF PRESENT ILLNESS:  67 y.o.  female presenting with relatively acute onset of hot and cold flashes with chills and rigors today. Endorse some nausea with vomiting but no diarrhea. Denies any dysuria or hematuria. Denies any recent sick contacts. Reports she did eat a salad at [facility] for dinner last night.  denies consumption of any raw or undercooked foods in the last 24 hours. Endorses some mild cough with questionable dyspnea. No rashes or abnormal bruising. No chest pain or pressure. Patient was found to be hypotensive and leukopenic in the emergency room with elevated lactic acid consistent with sepsis. Bolus IV fluids were administered along with broad-spectrum antibiotics including vancomycin, cefepime, and Flagyl. Critical care medicine was consulted given ongoing shock and potential for further clinical worsening.   SUBJECTIVE:  Patient reports minimal nausea. Continuing to have intermittent watery diarrhea with liquid diet intake. Denies any new abdominal pain. Denies any subjective fever or chills. Weaned off vasopressors at 9:15 last night.  REVIEW OF SYSTEMS:  No chest pain or pressure. Minimal cough. No dysphagia.  VITAL SIGNS: BP 118/75   Pulse 81   Temp 98 F (36.7 C) (Oral)   Resp (!) 21   Ht  (1.651 m)   Wt 176 lb 12.9 oz (80.2 kg)   SpO2 (!) 86%   BMI 29.42 kg/m   HEMODYNAMICS:    VENTILATOR SETTINGS:    INTAKE / OUTPUT: I/O last 3 completed shifts: In: 6356.5 [I.V.:4949.8; IV Piggyback:1406.7] Out: 4850 [Urine:4850]  PHYSICAL EXAMINATION: General:  Awake. No distress. Alert. Integument:  Warm. Dry. No rash.. Extremities:  No cyanosis or clubbing.  HEENT:  Moist mucous membranes. No scleral injection. No scleral  icterus.  Cardiovascular:  Regular rate. Regular rhythm. No edema  Pulmonary:  Normal work of breathing on room air. Clear bilaterally to auscultation. Abdomen: Protuberant. Nontender. Soft. Neurological:  No meningismus. Cranial nerves grossly intact. Oriented 4.  LABS:  BMET  Recent Labs Lab 10/01/17 0539 10/01/17 1535 10/02/17 0415  NA 141 141 143  K 4.0 3.4* 3.5  CL 114* 113* 114*  CO2 19* 21* 23  BUN CREATININE 1.14* 1.05* 1.05*  GLUCOSE 194* 188* 101*    Electrolytes  Recent Labs Lab 10/01/17 0539 10/01/17 1535 10/02/17 0358 10/02/17 0415  CALCIUM 8.2* 8.2*  --  8.0*  MG 1.4* 2.1 1.8  --   PHOS 1.6* 1.9*  --  2.3*    CBC  Recent Labs Lab 09/30/17 1650 10/01/17 0539 10/01/17 1050 10/02/17 0358  WBC 18.5* 33.0*  --  26.1*  HGB 11.9* 11.7*  --  11.3*  HCT 36.1 35.5*  --  35.2*  PLT 168 165 154 133*    Coag's  Recent Labs Lab 09/30/17 1650 10/01/17 1050  APTT 41* 51*  INR 1.44 1.73    Sepsis Markers  Recent Labs Lab 09/30/17 0908 09/30/17 1306 09/30/17 1650 10/01/17 0539 10/02/17 0358  LATICACIDVEN 4.72* 2.79* 2.0*  --   --   PROCALCITON  --   --  38.77 20.88 7.85    ABG No results for input(s): PHART, PCO2ART, PO2ART in the last 168 hours.  Liver Enzymes  Recent Labs Lab 09/30/17 4098 10/01/17 0539 10/01/17 1535 10/02/17 0415  AST 40 30  --   --   ALT 30 26  --   --   ALKPHOS 146* 85  --   --   BILITOT 1.1 0.9  --   --   ALBUMIN 3.7 2.8* 2.9* 2.7*    Cardiac Enzymes  Recent Labs Lab 09/30/17 1650  TROPONINI 0.06*    Glucose No results for input(s): GLUCAP in the last 168 hours.  Imaging No results found.  STUDIES:  PORT CXR 10/1:  Previously reviewed by me.  questionable increased interstitial markings particularly in the right base. No focal opacity or consolidation appreciated. No pleural effusion appreciated. Heart and mediastinal contours otherwise normal. RENAL U/S 10/1:  Normal size and  appearance of both kidneys. No evidence of hydronephrosis.  MICROBIOLOGY: Blood Cultures x2 10/1 >>> Urine Culture 10/1:  Negative  Respiratory Viral Panel PCR 10/1:  Negative  Influenza A/B PCR 10/1:  Negative  Urine Streptococcal Antigen 10/1:  Negative  Urine Legionella Antigen 10/1:  Negative  Gastrointestinal PCR Panel 10/1:  Negative  Stool C diff PCR 10/1 :  Positive    ANTIBIOTICS: Vancomycin 10/01 - 10/3 Levaquin 10/01 - 10/2 Tamiflu 10/01 - 10/2 Cefepime 10/01 - 10/2 Vancocin PO 10/2 >>> Flagyl 10/1 >>>  SIGNIFICANT EVENTS: 10/01 - Admit 10/02 - Levophed stopped around 9:15pm. Antibiotics tapered to Vancomycin IV, Vancocin PO, & Flagyl IV.   LINES/TUBES: R IJ CVL 10/1 >>>  ASSESSMENT / PLAN:  67 y.o. female with septic shock secondary to C. difficile colitis. Shock has resolved. Acute renal failure has resolved. Patient is having some more diarrhea today likely due to liquid diet. Electrolytes have stabilized. Given patient's continued clinical improvement she is stable for transition out of the ICU today.  1. Septic shock: Secondary to C. difficile colitis. Shock has resolved. Awaiting finalization on blood cultures. Discontinuing IV vancomycin. 2. C. difficile colitis: Continuing treatment with vancomycin by mouth & Flagyl IV. 3. Acute renal failure: Resolved. Monitoring urine output. Continuing IV fluid. 4. Nausea with vomiting and diarrhea: Continuing Zofran IV as needed. Continuing treatment of C. difficile as above. 5. Coagulopathy: Likely secondary to underlying low-level DIC. Trending coags daily. 6. Hypomagnesemia: Resolved. 7. Hypophosphatemia: Mild. Holding on replacement. Monitoring electrolytes daily. 8. Leukocytosis: Improving. Likely due to sepsis. Trending cell counts daily. 9. Anemia: Mild. No signs of active bleeding. Trending cell counts daily. 10. Thrombus cytopenia: Mild. Likely due to low-level DIC. Trending cell counts daily. 11. Hypokalemia:  Resolved. 12. Hypothyroidism: Continuing Synthroid.   Prophylaxis: SCDs & heparin subcutaneous every 8 hours. Diet:  Advancing to full liquid diet.  Code Status: Full Code. Disposition:  Transferring patient to telemetry unit. Family Update: Patient updated during rounds today.  I have spent a total of 36 minutes of time today caring for the patient, reviewing the patient's electronic medical record, and with more than 50% of that time spent coordinating transfer of care with the patient as well as reviewing the continuing plan of care with the patient at bedside.  TRH to assume care & PCCM off as of 10/4.   Donna Christen Jamison Neighbor, M.D. [facility] Pulmonary & Critical Care Pager:  [phone] After 7pm or if no response, call [phone] 10/02/2017, 9:41 AM

## 2017-10-03 DIAGNOSIS — E872 Acidosis: Secondary | ICD-10-CM

## 2017-10-03 DIAGNOSIS — N179 Acute kidney failure, unspecified: Secondary | ICD-10-CM

## 2017-10-03 LAB — RENAL FUNCTION PANEL
ANION GAP: 6 (ref 5–15)
Albumin: 2.7 g/dL — ABNORMAL LOW (ref 3.5–5.0)
BUN: 8 mg/dL (ref 6–20)
CO2: 25 mmol/L (ref 22–32)
CREATININE: 1.21 mg/dL — AB (ref 0.44–1.00)
Calcium: 8.2 mg/dL — ABNORMAL LOW (ref 8.9–10.3)
Chloride: 113 mmol/L — ABNORMAL HIGH (ref 101–111)
GFR calc non Af Amer: 45 mL/min — ABNORMAL LOW (ref >60)
GFR, EST AFRICAN AMERICAN: 52 mL/min — AB (ref >60)
Glucose, Bld: 152 mg/dL — ABNORMAL HIGH (ref 65–99)
POTASSIUM: 3.6 mmol/L (ref 3.5–5.1)
Phosphorus: 2.2 mg/dL — ABNORMAL LOW (ref 2.5–4.6)
Sodium: 144 mmol/L (ref 135–145)

## 2017-10-03 LAB — CBC WITH DIFFERENTIAL/PLATELET
Basophils Absolute: 0 10*3/uL (ref 0.0–0.1)
Basophils Relative: 0 %
Eosinophils Absolute: 0.1 10*3/uL (ref 0.0–0.7)
Eosinophils Relative: 0 %
HEMATOCRIT: 35.2 % — AB (ref 36.0–46.0)
Hemoglobin: 11.4 g/dL — ABNORMAL LOW (ref 12.0–15.0)
LYMPHS ABS: 2.4 10*3/uL (ref 0.7–4.0)
LYMPHS PCT: 12 %
MCH: 28.7 pg (ref 26.0–34.0)
MCHC: 32.4 g/dL (ref 30.0–36.0)
MCV: 88.7 fL (ref 78.0–100.0)
MONO ABS: 0.6 10*3/uL (ref 0.1–1.0)
MONOS PCT: 3 %
NEUTROS ABS: 17.1 10*3/uL — AB (ref 1.7–7.7)
Neutrophils Relative %: 85 %
Platelets: 130 10*3/uL — ABNORMAL LOW (ref 150–400)
RBC: 3.97 MIL/uL (ref 3.87–5.11)
RDW: 14.6 % (ref 11.5–15.5)
WBC: 20.1 10*3/uL — ABNORMAL HIGH (ref 4.0–10.5)

## 2017-10-03 LAB — APTT: APTT: 34 s (ref 24–36)

## 2017-10-03 LAB — PROTIME-INR
INR: 1.12
Prothrombin Time: 14.3 seconds (ref 11.4–15.2)

## 2017-10-03 LAB — MAGNESIUM: Magnesium: 1.6 mg/dL — ABNORMAL LOW (ref 1.7–2.4)

## 2017-10-03 LAB — LACTIC ACID, PLASMA: Lactic Acid, Venous: 1.4 mmol/L (ref 0.5–1.9)

## 2017-10-03 MED ORDER — TAMSULOSIN HCL 0.4 MG PO CAPS
0.4000 mg | ORAL_CAPSULE | Freq: Every day | ORAL | Status: DC
  Administered 2017-10-03 – 2017-10-05 (×3): 0.4 mg via ORAL
  Filled 2017-10-03 (×3): qty 1

## 2017-10-03 NOTE — Consult Note (Addendum)
Urology Consult   Physician requesting consult: Lawanda Cousins  Reason for consult: Ureteral calculus  History of Present Illness: Tracey Johnson is a 67 y.o. female with PMH significant for arthritis, hypercholesterolemia, and hypothyroidism who has been admitted since 09/30/17 for treatment of sepsis due to C Diff.  She had been progressing well until last night when she began to have severe LLQ pain.  CT scan revealed moderate left hydro with a 1mm UVJ stone.    She denies hx of stones and no family hx of stones. She is currently resting comfortably and states that her pain has improved since last night, however, she is taking pain meds on a regular basis.  She denies F/C, HA, CP, SOB, and N/V.  Her diarrhea is improving and she still has mild abdominal discomfort.    Past Medical History:  Diagnosis Date  . Arthritis   . Hypercholesterolemia   . Hypothyroidism     Past Surgical History:  Procedure Laterality Date  . TONSILLECTOMY  1971  . VAGINAL HYSTERECTOMY  1994     Current Hospital Medications:  Home meds:  Current Meds  Medication Sig  . HYDROcodone-acetaminophen (NORCO/VICODIN) 5-325 MG tablet Take 1 tablet by mouth every 6 (six) hours as needed for moderate pain.  Marland Kitchen levothyroxine (SYNTHROID, LEVOTHROID) 75 MCG tablet Take 75 mcg by mouth daily before breakfast.  . naproxen (NAPROSYN) 500 MG tablet Take 500 mg by mouth 2 (two) times daily with a meal.  . pravastatin (PRAVACHOL) 40 MG tablet Take 40 mg by mouth daily.   Marland Kitchen tiZANidine (ZANAFLEX) 2 MG tablet Take one tablet by mouth twice daily.  Marland Kitchen VIVELLE-DOT 0.075 MG/24HR APPLY 1 PATCH TWICE WEEKLY    Scheduled Meds: . Chlorhexidine Gluconate Cloth  6 each Topical Q0600  . Chlorhexidine Gluconate Cloth  6 each Topical Daily  . heparin  5,000 Units Subcutaneous Q8H  . levothyroxine  75 mcg Oral QAC breakfast  . sodium chloride flush  10-40 mL Intracatheter Q12H  . vancomycin  125 mg Oral Q6H   Continuous  Infusions: . sodium chloride    . sodium chloride    . dextrose 5% lactated ringers 125 mL/hr at 10/03/17 1141  . metronidazole Stopped (10/03/17 1238)   PRN Meds:.sodium chloride, sodium chloride, acetaminophen, guaiFENesin-dextromethorphan, ondansetron (ZOFRAN) IV, oxyCODONE, sodium chloride flush  Allergies:  Allergies  Allergen Reactions  . Erythromycin Rash    "most antibiotics"  . Penicillins Rash    "Most antibiotics" Has patient had a PCN reaction causing immediate rash, facial/tongue/throat swelling, SOB or lightheadedness with hypotension: Yes Has patient had a PCN reaction causing severe rash involving mucus membranes or skin necrosis: yes Has patient had a PCN reaction that required hospitalization:No Has patient had a PCN reaction occurring within the last 10 years:No If all of the above answers are "NO", then may proceed with Cephalosporin use.   . Sulfa Antibiotics Rash    "most antibiotics"    Family History  Problem Relation Age of Onset  . Colon cancer Neg Hx     Social History:  reports that she has been smoking Cigarettes.  She started smoking about 45 years ago. She has been smoking about 1.00 pack per day. She has never used smokeless tobacco. Her alcohol and drug histories are not on file.  ROS: A complete review of systems was performed.  All systems are negative except for pertinent findings as noted.  Physical Exam:  Vital signs in last 24 hours: Temp:  [97.9 F (  36.6 C)-98.5 F (36.9 C)] 97.9 F (36.6 C) (10/04 1426) Pulse Rate:  [85-92] 85 (10/04 1426) Resp:  [16-22] 16 (10/04 0502) BP: (127-135)/(67-83) 134/83 (10/04 1426) SpO2:  [90 %-94 %] 90 % (10/04 1426) Weight:  [81.2 kg (179 lb 0.2 oz)] 81.2 kg (179 lb 0.2 oz) (10/04 0502) Constitutional:  Alert and oriented, No acute distress Cardiovascular: Regular rate and rhythm Respiratory: Normal respiratory effort GI: Abdomen is soft, mildly tender, nondistended, no abdominal masses GU: mild  left CVA tenderness Lymphatic: No lymphadenopathy Neurologic: Grossly intact, no focal deficits Psychiatric: Normal mood and affect  Laboratory Data:   Recent Labs  10/01/17 0539 10/01/17 1050 10/02/17 0358 10/03/17 0418  WBC 33.0*  --  26.1* 20.1*  HGB 11.7*  --  11.3* 11.4*  HCT 35.5*  --  35.2* 35.2*  PLT 165 154 133* 130*     Recent Labs  10/01/17 0539 10/01/17 1535 10/02/17 0415 10/03/17 0418  NA 141 141 143 144  K 4.0 3.4* 3.5 3.6  CL 114* 113* 114* 113*  GLUCOSE 194* 188* 101* 152*  BUN CALCIUM 8.2* 8.2* 8.0* 8.2*  CREATININE 1.14* 1.05* 1.05* 1.21*     Results for orders placed or performed during the hospital encounter of 09/30/17 (from the past 24 hour(s))  CBC with Differential/Platelet     Status: Abnormal   Collection Time: 10/03/17  4:18 AM  Result Value Ref Range   WBC 20.1 (H) 4.0 - 10.5 K/uL   RBC 3.97 3.87 - 5.11 MIL/uL   Hemoglobin 11.4 (L) 12.0 - 15.0 g/dL   HCT 16.1 (L) 09.6 - 04.5 %   MCV 88.7 78.0 - 100.0 fL   MCH 28.7 26.0 - 34.0 pg   MCHC 32.4 30.0 - 36.0 g/dL   RDW 40.9 81.1 - 91.4 %   Platelets 130 (L) 150 - 400 K/uL   Neutrophils Relative % 85 %   Neutro Abs 17.1 (H) 1.7 - 7.7 K/uL   Lymphocytes Relative 12 %   Lymphs Abs 2.4 0.7 - 4.0 K/uL   Monocytes Relative 3 %   Monocytes Absolute 0.6 0.1 - 1.0 K/uL   Eosinophils Relative 0 %   Eosinophils Absolute 0.1 0.0 - 0.7 K/uL   Basophils Relative 0 %   Basophils Absolute 0.0 0.0 - 0.1 K/uL  Magnesium     Status: Abnormal   Collection Time: 10/03/17  4:18 AM  Result Value Ref Range   Magnesium 1.6 (L) 1.7 - 2.4 mg/dL  Renal function panel     Status: Abnormal   Collection Time: 10/03/17  4:18 AM  Result Value Ref Range   Sodium 144 135 - 145 mmol/L   Potassium 3.6 3.5 - 5.1 mmol/L   Chloride 113 (H) 101 - 111 mmol/L   CO2 25 22 - 32 mmol/L   Glucose, Bld 152 (H) 65 - 99 mg/dL   BUN 8 6 - 20 mg/dL   Creatinine, Ser 7.82 (H) 0.44 - 1.00 mg/dL   Calcium 8.2 (L)  8.9 - 10.3 mg/dL   Phosphorus 2.2 (L) 2.5 - 4.6 mg/dL   Albumin 2.7 (L) 3.5 - 5.0 g/dL   GFR calc non Af Amer 45 (L) >60 mL/min   GFR calc Af Amer 52 (L) >60 mL/min   Anion gap 6 5 - 15  Protime-INR     Status: None   Collection Time: 10/03/17  4:18 AM  Result Value Ref Range   Prothrombin Time 14.3 11.4 -  15.2 seconds   INR 1.12   APTT     Status: None   Collection Time: 10/03/17  4:18 AM  Result Value Ref Range   aPTT 34 24 - 36 seconds  Lactic acid, plasma     Status: None   Collection Time: 10/03/17  2:05 PM  Result Value Ref Range   Lactic Acid, Venous 1.4 0.5 - 1.9 mmol/L   Recent Results (from the past 240 hour(s))  Blood Culture (routine x 2)     Status: None (Preliminary result)   Collection Time: 09/30/17  8:40 AM  Result Value Ref Range Status   Specimen Description BLOOD BLOOD RIGHT FOREARM  Final   Special Requests   Final    BOTTLES DRAWN AEROBIC AND ANAEROBIC Blood Culture adequate volume   Culture   Final    NO GROWTH 3 DAYS Performed at [facility], [address] [place], [place] [address]    Report Status PENDING  Incomplete  Blood Culture (routine x 2)     Status: None (Preliminary result)   Collection Time: 09/30/17  8:56 AM  Result Value Ref Range Status   Specimen Description BLOOD LEFT ANTECUBITAL  Final   Special Requests   Final    BOTTLES DRAWN AEROBIC AND ANAEROBIC Blood Culture adequate volume   Culture   Final    NO GROWTH 3 DAYS Performed at [facility], [address] [place], [place] [address]    Report Status PENDING  Incomplete  Urine culture     Status: None   Collection Time: 09/30/17 12:49 PM  Result Value Ref Range Status   Specimen Description URINE, RANDOM  Final   Special Requests Normal  Final   Culture   Final    NO GROWTH Performed at [facility], [address], [place], [place] [address]    Report Status 10/01/2017 FINAL  Final  Respiratory Panel by PCR     Status: None   Collection Time:  09/30/17  2:53 PM  Result Value Ref Range Status   Adenovirus NOT DETECTED NOT DETECTED Final   Coronavirus 229E NOT DETECTED NOT DETECTED Final   Coronavirus HKU1 NOT DETECTED NOT DETECTED Final   Coronavirus NL63 NOT DETECTED NOT DETECTED Final   Coronavirus OC43 NOT DETECTED NOT DETECTED Final   Metapneumovirus NOT DETECTED NOT DETECTED Final   Rhinovirus / Enterovirus NOT DETECTED NOT DETECTED Final   Influenza A NOT DETECTED NOT DETECTED Final   Influenza B NOT DETECTED NOT DETECTED Final   Parainfluenza Virus 1 NOT DETECTED NOT DETECTED Final   Parainfluenza Virus 2 NOT DETECTED NOT DETECTED Final   Parainfluenza Virus 3 NOT DETECTED NOT DETECTED Final   Parainfluenza Virus 4 NOT DETECTED NOT DETECTED Final   Respiratory Syncytial Virus NOT DETECTED NOT DETECTED Final   Bordetella pertussis NOT DETECTED NOT DETECTED Final   Chlamydophila pneumoniae NOT DETECTED NOT DETECTED Final   Mycoplasma pneumoniae NOT DETECTED NOT DETECTED Final    Comment: Performed at [facility], [address] [place], [place] [address]  Gastrointestinal Panel by PCR , Stool     Status: None   Collection Time: 09/30/17  6:54 PM  Result Value Ref Range Status   Campylobacter species NOT DETECTED NOT DETECTED Final   Plesimonas shigelloides NOT DETECTED NOT DETECTED Final   Salmonella species NOT DETECTED NOT DETECTED Final   Yersinia enterocolitica NOT DETECTED NOT DETECTED Final   Vibrio species NOT DETECTED NOT DETECTED  Final   Vibrio cholerae NOT DETECTED NOT DETECTED Final   Enteroaggregative E coli (EAEC) NOT DETECTED NOT DETECTED Final   Enteropathogenic E coli (EPEC) NOT DETECTED NOT DETECTED Final   Enterotoxigenic E coli (ETEC) NOT DETECTED NOT DETECTED Final   Shiga like toxin producing E coli (STEC) NOT DETECTED NOT DETECTED Final   Shigella/Enteroinvasive E coli (EIEC) NOT DETECTED NOT DETECTED Final   Cryptosporidium NOT DETECTED NOT DETECTED Final   Cyclospora cayetanensis NOT  DETECTED NOT DETECTED Final   Entamoeba histolytica NOT DETECTED NOT DETECTED Final   Giardia lamblia NOT DETECTED NOT DETECTED Final   Adenovirus F40/41 NOT DETECTED NOT DETECTED Final   Astrovirus NOT DETECTED NOT DETECTED Final   Norovirus GI/GII NOT DETECTED NOT DETECTED Final   Rotavirus A NOT DETECTED NOT DETECTED Final   Sapovirus (I, II, IV, and V) NOT DETECTED NOT DETECTED Final  C difficile quick scan w PCR reflex     Status: Abnormal   Collection Time: 09/30/17  6:54 PM  Result Value Ref Range Status   C Diff antigen POSITIVE (A) NEGATIVE Final   C Diff toxin NEGATIVE NEGATIVE Final   C Diff interpretation Results are indeterminate. See PCR results.  Final  Clostridium Difficile by PCR     Status: Abnormal   Collection Time: 09/30/17  6:54 PM  Result Value Ref Range Status   Toxigenic C Difficile by pcr POSITIVE (A) NEGATIVE Final    Comment: Positive for toxigenic C. difficile with little to no toxin production. Only treat if clinical presentation suggests symptomatic illness. Performed at [facility], [address] [place], [place] [address]     Renal Function:  Recent Labs  09/30/17 6045 09/30/17 1650 10/01/17 0539 10/01/17 1535 10/02/17 0415 10/03/17 0418  CREATININE 1.46* 1.21* 1.14* 1.05* 1.05* 1.21*   Estimated Creatinine Clearance: 46.9 mL/min (A) (by C-G formula based on SCr of 1.21 mg/dL (H)).  Radiologic Imaging: Ct Abdomen Pelvis Wo Contrast  Result Date: 10/02/2017 CLINICAL DATA:  Left abdominal pain EXAM: CT ABDOMEN AND PELVIS WITHOUT CONTRAST TECHNIQUE: Multidetector CT imaging of the abdomen and pelvis was performed following the standard protocol without IV contrast. COMPARISON:  None. FINDINGS: Lower chest: There are patchy areas of consolidation at both lung bases associated with small bilateral pleural effusions. Hepatobiliary: Liver and gallbladder are unremarkable. Pancreas: Unremarkable Spleen: Unremarkable Adrenals/Urinary Tract:  Moderate left hydronephrosis and perinephric stranding are associated with a more 1 mm calculus at the left ureterovesical junction. There are no renal calculi. No evidence of right hydronephrosis. Adrenal glands are unremarkable. Bladder is within normal limits. Stomach/Bowel: Stomach is decompressed. No obvious mass in the colon. Appendix is unremarkable. No evidence of small-bowel obstruction. Mild diverticulosis of the descending and sigmoid colon. Vascular/Lymphatic: No abnormal retroperitoneal adenopathy. Atherosclerotic calcifications of the aorta and iliac vasculature are present. Reproductive: Uterus is absent.  Adnexa are within normal limits. Other: No free-fluid. Musculoskeletal: Advanced degenerative disc disease in the lumbar spine. No vertebral compression deformity. IMPRESSION: 1 mm left ureterovesical junction calculus is associated with secondary findings of left ureteral obstruction within the left kidney. Patchy bibasilar consolidation and small bilateral pleural effusions. Inflammatory process is not excluded. Electronically Signed   By: Jolaine Click M.D.   On: 10/02/2017 19:32    Impression/Recommendation:  Left UVJ stone with moderate hydonephrosis--pt should be able to pass 1mm stone without intervention.  Will add Flomax in case she has not yet passed it.  Strain urine.  Advised pt on dietary  changes for stone prevention.  Continue pain meds per IM.   DANCY, AMANDA 10/03/2017, 5:09 PM    Attestation:   I have seen and evaluated the patient and I agree with Harrie Foreman, PA assessment and plan.  Wilkie Aye

## 2017-10-03 NOTE — Care Management Important Message (Signed)
Important Message  Patient Details IM Letter given Cookie/Case Manager to present to Patient Name: Tracey Johnson MRN: [id number] Date of Birth: 10/19/1950   Medicare Important Message Given:  Yes    Caren Macadam 10/03/2017, 11:32 AM

## 2017-10-03 NOTE — Progress Notes (Signed)
PROGRESS NOTE    Tracey Johnson  MVH:[id number] DOB: Oct 15, 1950 DOA: 09/30/2017 PCP: Geoffry Paradise, MD    Brief Narrative: 67 year old lady with presented with nausea, vomiting, diarrhea. She was found to be hypotensive and leukopenic, with elevated lactic acid. She was admitted for evaluation for sepsis. She was started on IV vancomycin and IV cefepime and flagyl. Admitted by PCCM for septic shock. As her bp improved,  she clinically improved, she was transferred to [facility] service.   Assessment & Plan:   Active Problems:   Sepsis (HCC)   Septic shock (HCC)   Septic shock secondary to c diff colitis.  Improved.  On oral vancomycin and flagyl IV.    Acute renal failure:  ? From septic shock vs from ureteral stone.  Hydrate and continue to check renal parameters.   Nausea, vomiting and diarrhea:  Improved. Start on clears and advance diet as tolerated.   Hypomagnesemia and hypophosphatemia/ hypokalemia:  Replaced.  Repeat in am.   Anemia:    Hypothyroidism:  Resume synthroid.   Ureteral stone:  Would explain her lower abd pain radiating to theback.  Urology consulted and recommendations given.     DVT prophylaxis: (heparin sq Code Status: full code.  Family Communication: none at bedside. discussed the plan with pt  Disposition Plan: pending resolution of sepsis.    Consultants:   Urology .   Procedures: CT abd and pelvis   Antimicrobials:  Oral vancomycin and IV flagyl.   Subjective: Lower abd pain improved.   Nausea improved. No vomiting.   Objective: Vitals:   10/02/17 1400 10/02/17 1521 10/02/17 2131 10/03/17 0502  BP: (!) 145/117 138/76 135/80 127/67  Pulse: 85 81 92 91  Resp: (!) 23 (!) 22 (!) 22 16  Temp:  (!) 96.6 F (35.9 C) 98.5 F (36.9 C) 98.3 F (36.8 C)  TempSrc:  Oral Oral Oral  SpO2: 94% 97% 93% 94%  Weight:    81.2 kg (179 lb 0.2 oz)  Height:        Intake/Output Summary (Last 24 hours) at 10/03/17 1414 Last data filed at  10/03/17 0900  Gross per 24 hour  Intake             2200 ml  Output                0 ml  Net             2200 ml   Filed Weights   10/01/17 0500 10/02/17 0400 10/03/17 0502  Weight: 76.9 kg (169 lb 8.5 oz) 80.2 kg (176 lb 12.9 oz) 81.2 kg (179 lb 0.2 oz)    Examination:  General exam: Appears calm and comfortable  Respiratory system: Clear to auscultation. Respiratory effort normal. Cardiovascular system: S1 & S2 heard, RRR. No JVD, murmurs, rubs, gallops or clicks. No pedal edema. Gastrointestinal system: Abdomen is nondistended, soft and nontender. No organomegaly or masses felt. Normal bowel sounds heard. Central nervous system: Alert and oriented. No focal neurological deficits. Extremities: Symmetric 5 x 5 power. Skin: No rashes, lesions or ulcers Psychiatry: Judgement and insight appear normal. Mood & affect appropriate.     Data Reviewed: I have personally reviewed following labs and imaging studies  CBC:  Recent Labs Lab 09/30/17 0822 09/30/17 1650 10/01/17 0539 10/01/17 1050 10/02/17 0358 10/03/17 0418  WBC 2.4* 18.5* 33.0*  --  26.1* 20.1*  NEUTROABS 2.0  --  29.7*  --  22.2* 17.1*  HGB 15.0 11.9* 11.7*  --  11.3* 11.4*  HCT 44.7 36.1 35.5*  --  35.2* 35.2*  MCV 88.2 89.6 89.0  --  90.0 88.7  PLT 189 168 165 154 133* 130*   Basic Metabolic Panel:  Recent Labs Lab 09/30/17 0822 09/30/17 1650 10/01/17 0539 10/01/17 1535 10/02/17 0358 10/02/17 0415 10/03/17 0418  NA 143  --  141 141  --  143 144  K 3.4*  --  4.0 3.4*  --  3.5 3.6  CL 110  --  114* 113*  --  114* 113*  CO2 19*  --  19* 21*  --  23 25  GLUCOSE 94  --  194* 188*  --  101* 152*  BUN 21*  --  16 12  --  9 8  CREATININE 1.46* 1.21* 1.14* 1.05*  --  1.05* 1.21*  CALCIUM 9.6  --  8.2* 8.2*  --  8.0* 8.2*  MG  --   --  1.4* 2.1 1.8  --  1.6*  PHOS  --   --  1.6* 1.9*  --  2.3* 2.2*   GFR: Estimated Creatinine Clearance: 46.9 mL/min (A) (by C-G formula based on SCr of 1.21 mg/dL  (H)). Liver Function Tests:  Recent Labs Lab 09/30/17 0822 10/01/17 0539 10/01/17 1535 10/02/17 0415 10/03/17 0418  AST 40 30  --   --   --   ALT 30 26  --   --   --   ALKPHOS 146* 85  --   --   --   BILITOT 1.1 0.9  --   --   --   PROT 6.8 5.6*  --   --   --   ALBUMIN 3.7 2.8* 2.9* 2.7* 2.7*    Recent Labs Lab 09/30/17 0822 09/30/17 1650  LIPASE 23  --   AMYLASE  --  25*   No results for input(s): AMMONIA in the last 168 hours. Coagulation Profile:  Recent Labs Lab 09/30/17 1650 10/01/17 1050 10/02/17 1217 10/03/17 0418  INR 1.44 1.73 1.17 1.12   Cardiac Enzymes:  Recent Labs Lab 09/30/17 1650  TROPONINI 0.06*   BNP (last 3 results) No results for input(s): PROBNP in the last 8760 hours. HbA1C: No results for input(s): HGBA1C in the last 72 hours. CBG: No results for input(s): GLUCAP in the last 168 hours. Lipid Profile: No results for input(s): CHOL, HDL, LDLCALC, TRIG, CHOLHDL, LDLDIRECT in the last 72 hours. Thyroid Function Tests:  Recent Labs  09/30/17 1650  TSH 0.899  FREET4 1.47*   Anemia Panel: No results for input(s): VITAMINB12, FOLATE, FERRITIN, TIBC, IRON, RETICCTPCT in the last 72 hours. Sepsis Labs:  Recent Labs Lab 09/30/17 0908 09/30/17 1306 09/30/17 1650 10/01/17 0539 10/02/17 0358  PROCALCITON  --   --  38.77 20.88 7.85  LATICACIDVEN 4.72* 2.79* 2.0*  --   --     Recent Results (from the past 240 hour(s))  Blood Culture (routine x 2)     Status: None (Preliminary result)   Collection Time: 09/30/17  8:40 AM  Result Value Ref Range Status   Specimen Description BLOOD BLOOD RIGHT FOREARM  Final   Special Requests   Final    BOTTLES DRAWN AEROBIC AND ANAEROBIC Blood Culture adequate volume   Culture   Final    NO GROWTH 2 DAYS Performed at [facility], [address] [place], [place] [address]    Report Status PENDING  Incomplete  Blood Culture (routine x 2)     Status:  None (Preliminary result)    Collection Time: 09/30/17  8:56 AM  Result Value Ref Range Status   Specimen Description BLOOD LEFT ANTECUBITAL  Final   Special Requests   Final    BOTTLES DRAWN AEROBIC AND ANAEROBIC Blood Culture adequate volume   Culture   Final    NO GROWTH 2 DAYS Performed at [facility], [address], [place], [place] [address]    Report Status PENDING  Incomplete  Urine culture     Status: None   Collection Time: 09/30/17 12:49 PM  Result Value Ref Range Status   Specimen Description URINE, RANDOM  Final   Special Requests Normal  Final   Culture   Final    NO GROWTH Performed at [facility], [address], [place], [place] [address]    Report Status 10/01/2017 FINAL  Final  Respiratory Panel by PCR     Status: None   Collection Time: 09/30/17  2:53 PM  Result Value Ref Range Status   Adenovirus NOT DETECTED NOT DETECTED Final   Coronavirus 229E NOT DETECTED NOT DETECTED Final   Coronavirus HKU1 NOT DETECTED NOT DETECTED Final   Coronavirus NL63 NOT DETECTED NOT DETECTED Final   Coronavirus OC43 NOT DETECTED NOT DETECTED Final   Metapneumovirus NOT DETECTED NOT DETECTED Final   Rhinovirus / Enterovirus NOT DETECTED NOT DETECTED Final   Influenza A NOT DETECTED NOT DETECTED Final   Influenza B NOT DETECTED NOT DETECTED Final   Parainfluenza Virus 1 NOT DETECTED NOT DETECTED Final   Parainfluenza Virus 2 NOT DETECTED NOT DETECTED Final   Parainfluenza Virus 3 NOT DETECTED NOT DETECTED Final   Parainfluenza Virus 4 NOT DETECTED NOT DETECTED Final   Respiratory Syncytial Virus NOT DETECTED NOT DETECTED Final   Bordetella pertussis NOT DETECTED NOT DETECTED Final   Chlamydophila pneumoniae NOT DETECTED NOT DETECTED Final   Mycoplasma pneumoniae NOT DETECTED NOT DETECTED Final    Comment: Performed at [facility], [address] [place], [place] [address]  Gastrointestinal Panel by PCR , Stool     Status: None   Collection Time: 09/30/17  6:54 PM  Result Value Ref  Range Status   Campylobacter species NOT DETECTED NOT DETECTED Final   Plesimonas shigelloides NOT DETECTED NOT DETECTED Final   Salmonella species NOT DETECTED NOT DETECTED Final   Yersinia enterocolitica NOT DETECTED NOT DETECTED Final   Vibrio species NOT DETECTED NOT DETECTED Final   Vibrio cholerae NOT DETECTED NOT DETECTED Final   Enteroaggregative E coli (EAEC) NOT DETECTED NOT DETECTED Final   Enteropathogenic E coli (EPEC) NOT DETECTED NOT DETECTED Final   Enterotoxigenic E coli (ETEC) NOT DETECTED NOT DETECTED Final   Shiga like toxin producing E coli (STEC) NOT DETECTED NOT DETECTED Final   Shigella/Enteroinvasive E coli (EIEC) NOT DETECTED NOT DETECTED Final   Cryptosporidium NOT DETECTED NOT DETECTED Final   Cyclospora cayetanensis NOT DETECTED NOT DETECTED Final   Entamoeba histolytica NOT DETECTED NOT DETECTED Final   Giardia lamblia NOT DETECTED NOT DETECTED Final   Adenovirus F40/41 NOT DETECTED NOT DETECTED Final   Astrovirus NOT DETECTED NOT DETECTED Final   Norovirus GI/GII NOT DETECTED NOT DETECTED Final   Rotavirus A NOT DETECTED NOT DETECTED Final   Sapovirus (I, II, IV, and V) NOT DETECTED NOT DETECTED Final  C difficile quick scan w PCR reflex     Status: Abnormal   Collection Time: 09/30/17  6:54 PM  Result Value Ref Range Status  C Diff antigen POSITIVE (A) NEGATIVE Final   C Diff toxin NEGATIVE NEGATIVE Final   C Diff interpretation Results are indeterminate. See PCR results.  Final  Clostridium Difficile by PCR     Status: Abnormal   Collection Time: 09/30/17  6:54 PM  Result Value Ref Range Status   Toxigenic C Difficile by pcr POSITIVE (A) NEGATIVE Final    Comment: Positive for toxigenic C. difficile with little to no toxin production. Only treat if clinical presentation suggests symptomatic illness. Performed at [facility], [address] [place], [place] [address]          Radiology Studies: Ct Abdomen Pelvis Wo Contrast  Result  Date: 10/02/2017 CLINICAL DATA:  Left abdominal pain EXAM: CT ABDOMEN AND PELVIS WITHOUT CONTRAST TECHNIQUE: Multidetector CT imaging of the abdomen and pelvis was performed following the standard protocol without IV contrast. COMPARISON:  None. FINDINGS: Lower chest: There are patchy areas of consolidation at both lung bases associated with small bilateral pleural effusions. Hepatobiliary: Liver and gallbladder are unremarkable. Pancreas: Unremarkable Spleen: Unremarkable Adrenals/Urinary Tract: Moderate left hydronephrosis and perinephric stranding are associated with a more 1 mm calculus at the left ureterovesical junction. There are no renal calculi. No evidence of right hydronephrosis. Adrenal glands are unremarkable. Bladder is within normal limits. Stomach/Bowel: Stomach is decompressed. No obvious mass in the colon. Appendix is unremarkable. No evidence of small-bowel obstruction. Mild diverticulosis of the descending and sigmoid colon. Vascular/Lymphatic: No abnormal retroperitoneal adenopathy. Atherosclerotic calcifications of the aorta and iliac vasculature are present. Reproductive: Uterus is absent.  Adnexa are within normal limits. Other: No free-fluid. Musculoskeletal: Advanced degenerative disc disease in the lumbar spine. No vertebral compression deformity. IMPRESSION: 1 mm left ureterovesical junction calculus is associated with secondary findings of left ureteral obstruction within the left kidney. Patchy bibasilar consolidation and small bilateral pleural effusions. Inflammatory process is not excluded. Electronically Signed   By: Jolaine Click M.D.   On: 10/02/2017 19:32        Scheduled Meds: . Chlorhexidine Gluconate Cloth  6 each Topical Q0600  . Chlorhexidine Gluconate Cloth  6 each Topical Daily  . heparin  5,000 Units Subcutaneous Q8H  . levothyroxine  75 mcg Oral QAC breakfast  . sodium chloride flush  10-40 mL Intracatheter Q12H  . vancomycin  125 mg Oral Q6H   Continuous  Infusions: . sodium chloride    . sodium chloride    . dextrose 5% lactated ringers 125 mL/hr at 10/03/17 1141  . metronidazole Stopped (10/03/17 1238)     LOS: 3 days    Time spent: *35 min    Henreitta Spittler, MD [facility] Pager [phone]  If 7PM-7AM, please contact night-coverage [email or web address] Password [email or web address] 10/03/2017, 2:14 PM

## 2017-10-04 DIAGNOSIS — N179 Acute kidney failure, unspecified: Secondary | ICD-10-CM

## 2017-10-04 DIAGNOSIS — Z452 Encounter for adjustment and management of vascular access device: Secondary | ICD-10-CM

## 2017-10-04 MED ORDER — POTASSIUM CHLORIDE IN NACL 40-0.9 MEQ/L-% IV SOLN
INTRAVENOUS | Status: DC
  Filled 2017-10-04: qty 1000

## 2017-10-04 MED ORDER — POTASSIUM CHLORIDE IN NACL 40-0.9 MEQ/L-% IV SOLN
INTRAVENOUS | Status: AC
  Administered 2017-10-04 (×2): 100 mL/h via INTRAVENOUS
  Filled 2017-10-04 (×3): qty 1000

## 2017-10-04 MED ORDER — MAGNESIUM SULFATE 2 GM/50ML IV SOLN
2.0000 g | Freq: Once | INTRAVENOUS | Status: AC
  Administered 2017-10-04: 2 g via INTRAVENOUS
  Filled 2017-10-04: qty 50

## 2017-10-04 NOTE — Progress Notes (Signed)
OT Cancellation Note  Patient Details Name: Tracey Johnson MRN: [id number] DOB: 11/29/1950   Cancelled Treatment:    Reason Eval/Treat Not Completed: PT screened, no needs identified, will sign off  Genea Rheaume 10/04/2017, 11:16 AM  Marica Otter, OTR/L [phone] 10/04/2017

## 2017-10-04 NOTE — Progress Notes (Signed)
Spoke with pt concerning HH, discharge needs. Pt states there are no needs at present time. No HH at this time.

## 2017-10-04 NOTE — Evaluation (Signed)
Physical Therapy Evaluation Patient Details Name: Tracey Johnson MRN: [id number] DOB: Feb 27, 1950 Today's Date: 10/04/2017   History of Present Illness  67 yo female admitted with sepsis, ARF.  Clinical Impression  On eval, pt is Min guard assist for mobility. She walked ~250 feet x 2. Pt tolerated session well. Mildly unsteady intermittently during ambulation but no LOB. Do not anticipate any f/u PT needs at discharge-pt agrees. Recommend ambulation in hallway with nursing/family supervision. Will follow during hospital stay.     Follow Up Recommendations Supervision for mobility/OOB (initially until returns to baseline)    Equipment Recommendations  None recommended by PT    Recommendations for Other Services       Precautions / Restrictions Precautions Precautions: Fall Restrictions Weight Bearing Restrictions: No      Mobility  Bed Mobility Overal bed mobility: Independent                Transfers Overall transfer level: Independent                  Ambulation/Gait Ambulation/Gait assistance: Min guard Ambulation Distance (Feet): 250 Feet (x2) Assistive device:  IV pole; None Gait Pattern/deviations: Step-through pattern;Decreased stride length     General Gait Details: mildly unstedy intermittently. No LOB. Slow gait speed. Dyspnea 2/4. O2 sat 91% on RA. Seated rest break between walks  Stairs            Wheelchair Mobility    Modified Rankin (Stroke Patients Only)       Balance Overall balance assessment: Needs assistance           Standing balance-Leahy Scale: Good                               Pertinent Vitals/Pain Pain Assessment: 0-10 Pain Score: 3  Pain Location: back, stomach Pain Descriptors / Indicators: Aching;Sore Pain Intervention(s): Monitored during session    Home Living Family/patient expects to be discharged to:: Private residence Living Arrangements: Spouse/significant other   Type of Home:   (townhome) Home Access: Level entry     Home Layout: One level Home Equipment: Cane - single point      Prior Function Level of Independence: Independent with assistive device(s)         Comments: uses cane PRN when back hurts     Hand Dominance        Extremity/Trunk Assessment   Upper Extremity Assessment Upper Extremity Assessment: Overall WFL for tasks assessed    Lower Extremity Assessment Lower Extremity Assessment: Generalized weakness    Cervical / Trunk Assessment Cervical / Trunk Assessment: Normal  Communication   Communication: No difficulties  Cognition Arousal/Alertness: Awake/alert Behavior During Therapy: WFL for tasks assessed/performed Overall Cognitive Status: Within Functional Limits for tasks assessed                                        General Comments      Exercises     Assessment/Plan    PT Assessment Patient needs continued PT services  PT Problem List Decreased mobility;Decreased strength;Decreased activity tolerance       PT Treatment Interventions Gait training;Therapeutic activities;Therapeutic exercise;Functional mobility training;Patient/family education    PT Goals (Current goals can be found in the Care Plan section)  Acute Rehab PT Goals Patient Stated Goal: regain PLOF. home. PT Goal Formulation:  With patient Time For Goal Achievement: 10/18/17 Potential to Achieve Goals: Good    Frequency Min 3X/week   Barriers to discharge        Co-evaluation               AM-PAC PT "6 Clicks" Daily Activity  Outcome Measure Difficulty turning over in bed (including adjusting bedclothes, sheets and blankets)?: None Difficulty moving from lying on back to sitting on the side of the bed? : None Difficulty sitting down on and standing up from a chair with arms (e.g., wheelchair, bedside commode, etc,.)?: None Help needed moving to and from a bed to chair (including a wheelchair)?: None Help needed  walking in hospital room?: A Little Help needed climbing 3-5 steps with a railing? : A Little 6 Click Score: 22    End of Session Equipment Utilized During Treatment: Gait belt Activity Tolerance: Patient tolerated treatment well Patient left: in bed;with call bell/phone within reach   PT Visit Diagnosis: Difficulty in walking, not elsewhere classified (R26.2);Muscle weakness (generalized) (M62.81)    Time: 1191-4782 PT Time Calculation (min) (ACUTE ONLY): 27 min   Charges:   PT Evaluation $PT Eval Low Complexity: 1 Low PT Treatments $Gait Training: 8-22 mins   PT G Codes:        Rebeca Alert, MPT Pager: [phone]

## 2017-10-04 NOTE — Plan of Care (Signed)
Problem: Skin Integrity: Goal: Risk for impaired skin integrity will decrease Outcome: Completed/Met Date Met: 10/04/17 Pt is oob 1 assist. Pt mobilizes well.

## 2017-10-04 NOTE — Progress Notes (Signed)
PROGRESS NOTE    Tracey Johnson  ZOX:[id number] DOB: [birth year] DOA: 09/30/2017 PCP: Geoffry Paradise, MD    Brief Narrative: 67 year old lady with presented with nausea, vomiting, diarrhea. She was found to be hypotensive and leukopenic, with elevated lactic acid. She was admitted for evaluation for sepsis. She was started on IV vancomycin and IV cefepime and flagyl. Eventually found to have C. difficile colitis. Admitted by PCCM for septic shock. Clinically improving, diarrhea is improving, acute kidney injury is improving, she was transferred to [facility] service 10/4.   Assessment & Plan:   Active Problems:   Sepsis (HCC)   Septic shock (HCC)   AKI (acute kidney injury) (HCC)   Encounter for central line placement   Septic shock secondary to c diff colitis.  Blood culture, urine culture no growth so far On oral vancomycin, dc  flagyl IV as diarrhea has improved .    Acute renal failure: Creatinine 1.46 on admission, now 1.21 ? From septic shock vs from ureteral stone.  Hydrate and continue to check renal parameters.   Nausea, vomiting and diarrhea:  Advance to soft diet  Hypomagnesemia and hypophosphatemia/ hypokalemia:  Replaced.  Repeat in am.   Anemia: Stable   Hypothyroidism:  Resume synthroid.   Ureteral stone:  Would explain her lower abd pain radiating to the back.  Patient has been evaluated by urology Left UVJ stone with moderate hydonephrosis--pt should be able to pass 1mm stone without intervention Added hydration Also started on Flomax   DVT prophylaxis: (heparin sq Code Status: full code.  Family Communication: none at bedside. discussed the plan with pt  Disposition Plan: PT/OT consult, anticipate discharge in 2-3 days   Consultants:   Urology .   Procedures: CT abd and pelvis   Antimicrobials:   Anti-infectives    Start     Dose/Rate Route Frequency Ordered Stop   10/02/17 1400  levofloxacin (LEVAQUIN) IVPB 750 mg  Status:  Discontinued       750 mg 100 mL/hr over 90 Minutes Intravenous Every 48 hours 09/30/17 1356 10/01/17 1456   10/01/17 1800  vancomycin (VANCOCIN) 50 mg/mL oral solution 125 mg     125 mg Oral Every 6 hours 10/01/17 1456     10/01/17 1000  vancomycin (VANCOCIN) IVPB 1000 mg/200 mL premix  Status:  Discontinued     1,000 mg 200 mL/hr over 60 Minutes Intravenous Every 24 hours 09/30/17 1356 10/01/17 0945   10/01/17 1000  oseltamivir (TAMIFLU) capsule 30 mg  Status:  Discontinued     30 mg Oral 2 times daily 10/01/17 0938 10/01/17 1012   10/01/17 1000  vancomycin (VANCOCIN) IVPB 750 mg/150 ml premix  Status:  Discontinued     750 mg 150 mL/hr over 60 Minutes Intravenous Every 12 hours 10/01/17 0945 10/02/17 0952   10/01/17 1000  ceFEPIme (MAXIPIME) 1 g in dextrose 5 % 50 mL IVPB  Status:  Discontinued     1 g 100 mL/hr over 30 Minutes Intravenous Every 12 hours 10/01/17 0945 10/01/17 1735   10/01/17 0900  ceFEPIme (MAXIPIME) 1 g in dextrose 5 % 50 mL IVPB  Status:  Discontinued     1 g 100 mL/hr over 30 Minutes Intravenous Every 24 hours 09/30/17 1356 10/01/17 0945   09/30/17 1930  metroNIDAZOLE (FLAGYL) IVPB 500 mg     500 mg 100 mL/hr over 60 Minutes Intravenous Every 8 hours 09/30/17 1856     09/30/17 1400  levofloxacin (LEVAQUIN) IVPB 750 mg  750 mg 100 mL/hr over 90 Minutes Intravenous  Once 09/30/17 1340 09/30/17 1843   09/30/17 1400  oseltamivir (TAMIFLU) capsule 75 mg  Status:  Discontinued     75 mg Oral 2 times daily 09/30/17 1342 10/01/17 0938   09/30/17 1345  aztreonam (AZACTAM) 2 g in dextrose 5 % 50 mL IVPB  Status:  Discontinued     2 g 100 mL/hr over 30 Minutes Intravenous  Once 09/30/17 1340 09/30/17 1350   09/30/17 1345  vancomycin (VANCOCIN) IVPB 1000 mg/200 mL premix  Status:  Discontinued     1,000 mg 200 mL/hr over 60 Minutes Intravenous  Once 09/30/17 1340 09/30/17 1350   09/30/17 1015  vancomycin (VANCOCIN) IVPB 1000 mg/200 mL premix     1,000 mg 200 mL/hr over 60 Minutes  Intravenous  Once 09/30/17 1006 09/30/17 1151   09/30/17 0945  metroNIDAZOLE (FLAGYL) IVPB 500 mg     500 mg 100 mL/hr over 60 Minutes Intravenous  Once 09/30/17 0942 09/30/17 1150   09/30/17 0830  ceFEPIme (MAXIPIME) 2 g in dextrose 5 % 50 mL IVPB     2 g 100 mL/hr over 30 Minutes Intravenous  Once 09/30/17 0828 09/30/17 0931       Subjective: Feels well, had breakfast , no diarrhea today  Objective: Vitals:   10/03/17 0502 10/03/17 1426 10/03/17 2105 10/04/17 0516  BP: 127/67 134/83 130/75 127/77  Pulse: 91 85 79 82  Resp: Temp: 98.3 F (36.8 C) 97.9 F (36.6 C) 98.7 F (37.1 C) 97.9 F (36.6 C)  TempSrc: Oral Oral Oral Oral  SpO2: 94% 90% 93% 92%  Weight: 81.2 kg (179 lb 0.2 oz)   82.2 kg (181 lb 3.5 oz)  Height:        Intake/Output Summary (Last 24 hours) at 10/04/17 0804 Last data filed at 10/04/17 0740  Gross per 24 hour  Intake             3800 ml  Output             3000 ml  Net              800 ml   Filed Weights   10/02/17 0400 10/03/17 0502 10/04/17 0516  Weight: 80.2 kg (176 lb 12.9 oz) 81.2 kg (179 lb 0.2 oz) 82.2 kg (181 lb 3.5 oz)    Examination:  General exam: Appears calm and comfortable  Respiratory system: Clear to auscultation. Respiratory effort normal. Cardiovascular system: S1 & S2 heard, RRR. No JVD, murmurs, rubs, gallops or clicks. No pedal edema. Gastrointestinal system: Abdomen is nondistended, soft and nontender. No organomegaly or masses felt. Normal bowel sounds heard. Central nervous system: Alert and oriented. No focal neurological deficits. Extremities: Symmetric 5 x 5 power. Skin: No rashes, lesions or ulcers Psychiatry: Judgement and insight appear normal. Mood & affect appropriate.     Data Reviewed: I have personally reviewed following labs and imaging studies  CBC:  Recent Labs Lab 09/30/17 0822 09/30/17 1650 10/01/17 0539 10/01/17 1050 10/02/17 0358 10/03/17 0418  WBC 2.4* 18.5* 33.0*  --  26.1*  20.1*  NEUTROABS 2.0  --  29.7*  --  22.2* 17.1*  HGB 15.0 11.9* 11.7*  --  11.3* 11.4*  HCT 44.7 36.1 35.5*  --  35.2* 35.2*  MCV 88.2 89.6 89.0  --  90.0 88.7  PLT 189 168 165 154 133* 130*   Basic Metabolic Panel:  Recent Labs Lab 09/30/17 0822 09/30/17  1650 10/01/17 0539 10/01/17 1535 10/02/17 0358 10/02/17 0415 10/03/17 0418  NA 143  --  141 141  --  143 144  K 3.4*  --  4.0 3.4*  --  3.5 3.6  CL 110  --  114* 113*  --  114* 113*  CO2 19*  --  19* 21*  --  23 25  GLUCOSE 94  --  194* 188*  --  101* 152*  BUN 21*  --  16 12  --  9 8  CREATININE 1.46* 1.21* 1.14* 1.05*  --  1.05* 1.21*  CALCIUM 9.6  --  8.2* 8.2*  --  8.0* 8.2*  MG  --   --  1.4* 2.1 1.8  --  1.6*  PHOS  --   --  1.6* 1.9*  --  2.3* 2.2*   GFR: Estimated Creatinine Clearance: 47.1 mL/min (A) (by C-G formula based on SCr of 1.21 mg/dL (H)). Liver Function Tests:  Recent Labs Lab 09/30/17 0822 10/01/17 0539 10/01/17 1535 10/02/17 0415 10/03/17 0418  AST 40 30  --   --   --   ALT 30 26  --   --   --   ALKPHOS 146* 85  --   --   --   BILITOT 1.1 0.9  --   --   --   PROT 6.8 5.6*  --   --   --   ALBUMIN 3.7 2.8* 2.9* 2.7* 2.7*    Recent Labs Lab 09/30/17 0822 09/30/17 1650  LIPASE 23  --   AMYLASE  --  25*   No results for input(s): AMMONIA in the last 168 hours. Coagulation Profile:  Recent Labs Lab 09/30/17 1650 10/01/17 1050 10/02/17 1217 10/03/17 0418  INR 1.44 1.73 1.17 1.12   Cardiac Enzymes:  Recent Labs Lab 09/30/17 1650  TROPONINI 0.06*   BNP (last 3 results) No results for input(s): PROBNP in the last 8760 hours. HbA1C: No results for input(s): HGBA1C in the last 72 hours. CBG: No results for input(s): GLUCAP in the last 168 hours. Lipid Profile: No results for input(s): CHOL, HDL, LDLCALC, TRIG, CHOLHDL, LDLDIRECT in the last 72 hours. Thyroid Function Tests: No results for input(s): TSH, T4TOTAL, FREET4, T3FREE, THYROIDAB in the last 72 hours. Anemia  Panel: No results for input(s): VITAMINB12, FOLATE, FERRITIN, TIBC, IRON, RETICCTPCT in the last 72 hours. Sepsis Labs:  Recent Labs Lab 09/30/17 0908 09/30/17 1306 09/30/17 1650 10/01/17 0539 10/02/17 0358 10/03/17 1405  PROCALCITON  --   --  38.77 20.88 7.85  --   LATICACIDVEN 4.72* 2.79* 2.0*  --   --  1.4    Recent Results (from the past 240 hour(s))  Blood Culture (routine x 2)     Status: None (Preliminary result)   Collection Time: 09/30/17  8:40 AM  Result Value Ref Range Status   Specimen Description BLOOD BLOOD RIGHT FOREARM  Final   Special Requests   Final    BOTTLES DRAWN AEROBIC AND ANAEROBIC Blood Culture adequate volume   Culture   Final    NO GROWTH 3 DAYS Performed at [facility], [address] [place], [place] [address]    Report Status PENDING  Incomplete  Blood Culture (routine x 2)     Status: None (Preliminary result)   Collection Time: 09/30/17  8:56 AM  Result Value Ref Range Status   Specimen Description BLOOD LEFT ANTECUBITAL  Final   Special Requests   Final  BOTTLES DRAWN AEROBIC AND ANAEROBIC Blood Culture adequate volume   Culture   Final    NO GROWTH 3 DAYS Performed at [facility], [address], [place], [place] [address]    Report Status PENDING  Incomplete  Urine culture     Status: None   Collection Time: 09/30/17 12:49 PM  Result Value Ref Range Status   Specimen Description URINE, RANDOM  Final   Special Requests Normal  Final   Culture   Final    NO GROWTH Performed at [facility], [address], [place], [place] [address]    Report Status 10/01/2017 FINAL  Final  Respiratory Panel by PCR     Status: None   Collection Time: 09/30/17  2:53 PM  Result Value Ref Range Status   Adenovirus NOT DETECTED NOT DETECTED Final   Coronavirus 229E NOT DETECTED NOT DETECTED Final   Coronavirus HKU1 NOT DETECTED NOT DETECTED Final   Coronavirus NL63 NOT DETECTED NOT DETECTED Final   Coronavirus OC43 NOT  DETECTED NOT DETECTED Final   Metapneumovirus NOT DETECTED NOT DETECTED Final   Rhinovirus / Enterovirus NOT DETECTED NOT DETECTED Final   Influenza A NOT DETECTED NOT DETECTED Final   Influenza B NOT DETECTED NOT DETECTED Final   Parainfluenza Virus 1 NOT DETECTED NOT DETECTED Final   Parainfluenza Virus 2 NOT DETECTED NOT DETECTED Final   Parainfluenza Virus 3 NOT DETECTED NOT DETECTED Final   Parainfluenza Virus 4 NOT DETECTED NOT DETECTED Final   Respiratory Syncytial Virus NOT DETECTED NOT DETECTED Final   Bordetella pertussis NOT DETECTED NOT DETECTED Final   Chlamydophila pneumoniae NOT DETECTED NOT DETECTED Final   Mycoplasma pneumoniae NOT DETECTED NOT DETECTED Final    Comment: Performed at [facility], [address], [place], [place] [address]  Gastrointestinal Panel by PCR , Stool     Status: None   Collection Time: 09/30/17  6:54 PM  Result Value Ref Range Status   Campylobacter species NOT DETECTED NOT DETECTED Final   Plesimonas shigelloides NOT DETECTED NOT DETECTED Final   Salmonella species NOT DETECTED NOT DETECTED Final   Yersinia enterocolitica NOT DETECTED NOT DETECTED Final   Vibrio species NOT DETECTED NOT DETECTED Final   Vibrio cholerae NOT DETECTED NOT DETECTED Final   Enteroaggregative E coli (EAEC) NOT DETECTED NOT DETECTED Final   Enteropathogenic E coli (EPEC) NOT DETECTED NOT DETECTED Final   Enterotoxigenic E coli (ETEC) NOT DETECTED NOT DETECTED Final   Shiga like toxin producing E coli (STEC) NOT DETECTED NOT DETECTED Final   Shigella/Enteroinvasive E coli (EIEC) NOT DETECTED NOT DETECTED Final   Cryptosporidium NOT DETECTED NOT DETECTED Final   Cyclospora cayetanensis NOT DETECTED NOT DETECTED Final   Entamoeba histolytica NOT DETECTED NOT DETECTED Final   Giardia lamblia NOT DETECTED NOT DETECTED Final   Adenovirus F40/41 NOT DETECTED NOT DETECTED Final   Astrovirus NOT DETECTED NOT DETECTED Final   Norovirus GI/GII NOT DETECTED NOT  DETECTED Final   Rotavirus A NOT DETECTED NOT DETECTED Final   Sapovirus (I, II, IV, and V) NOT DETECTED NOT DETECTED Final  C difficile quick scan w PCR reflex     Status: Abnormal   Collection Time: 09/30/17  6:54 PM  Result Value Ref Range Status   C Diff antigen POSITIVE (A) NEGATIVE Final   C Diff toxin NEGATIVE NEGATIVE Final   C Diff interpretation Results are indeterminate. See PCR results.  Final  Clostridium Difficile by PCR  Status: Abnormal   Collection Time: 09/30/17  6:54 PM  Result Value Ref Range Status   Toxigenic C Difficile by pcr POSITIVE (A) NEGATIVE Final    Comment: Positive for toxigenic C. difficile with little to no toxin production. Only treat if clinical presentation suggests symptomatic illness. Performed at [facility], [address], [place], [place] [address]          Radiology Studies: Ct Abdomen Pelvis Wo Contrast  Result Date: 10/02/2017 CLINICAL DATA:  Left abdominal pain EXAM: CT ABDOMEN AND PELVIS WITHOUT CONTRAST TECHNIQUE: Multidetector CT imaging of the abdomen and pelvis was performed following the standard protocol without IV contrast. COMPARISON:  None. FINDINGS: Lower chest: There are patchy areas of consolidation at both lung bases associated with small bilateral pleural effusions. Hepatobiliary: Liver and gallbladder are unremarkable. Pancreas: Unremarkable Spleen: Unremarkable Adrenals/Urinary Tract: Moderate left hydronephrosis and perinephric stranding are associated with a more 1 mm calculus at the left ureterovesical junction. There are no renal calculi. No evidence of right hydronephrosis. Adrenal glands are unremarkable. Bladder is within normal limits. Stomach/Bowel: Stomach is decompressed. No obvious mass in the colon. Appendix is unremarkable. No evidence of small-bowel obstruction. Mild diverticulosis of the descending and sigmoid colon. Vascular/Lymphatic: No abnormal retroperitoneal adenopathy. Atherosclerotic  calcifications of the aorta and iliac vasculature are present. Reproductive: Uterus is absent.  Adnexa are within normal limits. Other: No free-fluid. Musculoskeletal: Advanced degenerative disc disease in the lumbar spine. No vertebral compression deformity. IMPRESSION: 1 mm left ureterovesical junction calculus is associated with secondary findings of left ureteral obstruction within the left kidney. Patchy bibasilar consolidation and small bilateral pleural effusions. Inflammatory process is not excluded. Electronically Signed   By: Jolaine Click M.D.   On: 10/02/2017 19:32        Scheduled Meds: . Chlorhexidine Gluconate Cloth  6 each Topical Q0600  . Chlorhexidine Gluconate Cloth  6 each Topical Daily  . heparin  5,000 Units Subcutaneous Q8H  . levothyroxine  75 mcg Oral QAC breakfast  . sodium chloride flush  10-40 mL Intracatheter Q12H  . tamsulosin  0.4 mg Oral Daily  . vancomycin  125 mg Oral Q6H   Continuous Infusions: . 0.9 % NaCl with KCl 40 mEq / L    . dextrose 5% lactated ringers 125 mL/hr at 10/04/17 0513  . magnesium sulfate 1 - 4 g bolus IVPB    . metronidazole Stopped (10/04/17 0359)     LOS: 4 days    Time spent: *35 min    Richarda Overlie, MD [facility] Pager [phone]  If 7PM-7AM, please contact night-coverage [email or web address] Password [email or web address] 10/04/2017, 8:04 AM

## 2017-10-05 DIAGNOSIS — A0472 Enterocolitis due to Clostridium difficile, not specified as recurrent: Secondary | ICD-10-CM

## 2017-10-05 DIAGNOSIS — R109 Unspecified abdominal pain: Secondary | ICD-10-CM

## 2017-10-05 LAB — CBC
HEMATOCRIT: 37.1 % (ref 36.0–46.0)
Hemoglobin: 11.9 g/dL — ABNORMAL LOW (ref 12.0–15.0)
MCH: 29 pg (ref 26.0–34.0)
MCHC: 32.1 g/dL (ref 30.0–36.0)
MCV: 90.5 fL (ref 78.0–100.0)
PLATELETS: 168 10*3/uL (ref 150–400)
RBC: 4.1 MIL/uL (ref 3.87–5.11)
RDW: 14.4 % (ref 11.5–15.5)
WBC: 10.4 10*3/uL (ref 4.0–10.5)

## 2017-10-05 LAB — COMPREHENSIVE METABOLIC PANEL
ALT: 24 U/L (ref 14–54)
AST: 35 U/L (ref 15–41)
Albumin: 3 g/dL — ABNORMAL LOW (ref 3.5–5.0)
Alkaline Phosphatase: 125 U/L (ref 38–126)
Anion gap: 8 (ref 5–15)
BILIRUBIN TOTAL: 0.5 mg/dL (ref 0.3–1.2)
BUN: 6 mg/dL (ref 6–20)
CHLORIDE: 107 mmol/L (ref 101–111)
CO2: 26 mmol/L (ref 22–32)
Calcium: 8.2 mg/dL — ABNORMAL LOW (ref 8.9–10.3)
Creatinine, Ser: 0.8 mg/dL (ref 0.44–1.00)
Glucose, Bld: 152 mg/dL — ABNORMAL HIGH (ref 65–99)
POTASSIUM: 3.7 mmol/L (ref 3.5–5.1)
Sodium: 141 mmol/L (ref 135–145)
Total Protein: 5.9 g/dL — ABNORMAL LOW (ref 6.5–8.1)

## 2017-10-05 LAB — CULTURE, BLOOD (ROUTINE X 2)
CULTURE: NO GROWTH
CULTURE: NO GROWTH
Special Requests: ADEQUATE
Special Requests: ADEQUATE

## 2017-10-05 LAB — MAGNESIUM: Magnesium: 1.7 mg/dL (ref 1.7–2.4)

## 2017-10-05 LAB — PHOSPHORUS: PHOSPHORUS: 2.1 mg/dL — AB (ref 2.5–4.6)

## 2017-10-05 MED ORDER — ONDANSETRON HCL 4 MG PO TABS: 4.0000 mg | ORAL_TABLET | Freq: Three times a day (TID) | ORAL | 1 refills | Status: DC | PRN

## 2017-10-05 MED ORDER — TAMSULOSIN HCL 0.4 MG PO CAPS: 0.4000 mg | ORAL_CAPSULE | Freq: Every day | ORAL | 0 refills | Status: DC

## 2017-10-05 MED ORDER — K PHOS MONO-SOD PHOS DI & MONO 155-852-130 MG PO TABS: 500.0000 mg | ORAL_TABLET | Freq: Two times a day (BID) | ORAL | 0 refills | Status: AC

## 2017-10-05 MED ORDER — K PHOS MONO-SOD PHOS DI & MONO 155-852-130 MG PO TABS
500.0000 mg | ORAL_TABLET | Freq: Three times a day (TID) | ORAL | Status: DC
  Administered 2017-10-05: 500 mg via ORAL
  Filled 2017-10-05 (×2): qty 2

## 2017-10-05 MED ORDER — GUAIFENESIN-DM 100-10 MG/5ML PO SYRP: 5.0000 mL | ORAL_SOLUTION | ORAL | 0 refills | Status: DC | PRN

## 2017-10-05 MED ORDER — VANCOMYCIN HCL 125 MG PO CAPS
125.0000 mg | ORAL_CAPSULE | Freq: Four times a day (QID) | ORAL | 0 refills | Status: DC
Start: 2017-10-05 — End: 2018-02-24

## 2017-10-05 MED ORDER — SACCHAROMYCES BOULARDII 250 MG PO CAPS: 250.0000 mg | ORAL_CAPSULE | Freq: Two times a day (BID) | ORAL | 0 refills | Status: DC

## 2017-10-05 MED ORDER — K PHOS MONO-SOD PHOS DI & MONO 155-852-130 MG PO TABS: 500.0000 mg | ORAL_TABLET | Freq: Three times a day (TID) | ORAL | 0 refills | Status: DC

## 2017-10-05 MED ORDER — OXYCODONE HCL 5 MG PO TABS: 5.0000 mg | ORAL_TABLET | Freq: Four times a day (QID) | ORAL | 0 refills | Status: DC | PRN

## 2017-10-05 MED ORDER — ONDANSETRON HCL 4 MG/2ML IJ SOLN: 4.0000 mg | Freq: Four times a day (QID) | INTRAMUSCULAR | 0 refills | Status: DC | PRN

## 2017-10-05 MED ORDER — VANCOMYCIN 50 MG/ML ORAL SOLUTION: 125.0000 mg | Freq: Four times a day (QID) | ORAL | 0 refills | Status: DC

## 2017-10-05 NOTE — Care Management Note (Signed)
Case Management Note  Patient Details  Name: Tracey Johnson MRN: [id number] Date of Birth: Oct 14, 1950  Subjective/Objective:           Sepsis, ARF         Action/Plan: Discharge Planning: Chart reviewed. Pt will dc on Vancomycin. Contacted attending to follow up on oral solution vs capsule. Insurance does not cover oral solution.   PCP Geoffry Paradise MD  Expected Discharge Date:  10/05/17               Expected Discharge Plan:  Home/Self Care  In-House Referral:  NA  Discharge planning Services  CM Consult  Post Acute Care Choice:  NA Choice offered to:  NA  DME Arranged:  N/A DME Agency:  NA  HH Arranged:  NA HH Agency:  NA  Status of Service:  Completed, signed off  If discussed at Long Length of Stay Meetings, dates discussed:    Additional Comments:  Elliot Cousin, RN 10/05/2017, 3:23 PM

## 2017-10-05 NOTE — Discharge Summary (Signed)
Physician Discharge Summary  Tracey Johnson UJW:[id number] DOB: [birth year] DOA: 09/30/2017  PCP: Geoffry Paradise, MD  Admit date: 09/30/2017 Discharge date: 10/05/2017  Admitted From: Home Disposition: Home  Recommendations for Outpatient Follow-up:  1. Follow up with PCP in 1-2 weeks 2. Follow up with Urology as an outpatient for Ureteral Stone 3. Please obtain CMP/CBC, Mag, Phos in one week 4. Please follow up on the following pending results:  Home Health: No Equipment/Devices: None    Discharge Condition: Stable CODE STATUS: FULL CODE Diet recommendation: Heart Healthy Diet   Brief/Interim Summary: The patient is a 67 year old lady who presented with nausea, vomiting, diarrhea. She was found to be hypotensive and leukopenic, with elevated lactic acid. She was admitted for evaluation for sepsis. She was started on IV vancomycin and IV cefepime and flagyl. Eventually found to have C. difficile colitis. Admitted by PCCM for septic shock. Clinically improved as diarrhea is improving, acute kidney injury is improving, she was transferred to [facility] service 10/4. She was deemed medically stable as she improved and will be D/C'd Home and need to follow up with PCP closely and continue po Vancomycin.   Discharge Diagnoses:  Active Problems:   Sepsis (HCC)   Septic shock (HCC)   AKI (acute kidney injury) (HCC)   Encounter for central line placement  Septic shock secondary to c diff colitis, improved -Blood culture, urine culture no growth so far -On oral vancomycin, dc  flagyl IV as diarrhea has improved -Procalcitonin went from 20.88 -> 7.85 -WBC improved to 10.4 -LA was 1.4 -C/w oral vancomycin until completion of course  Acute renal failure/AKI, improved -Creatinine 1.46 on admission, now 0l80 ? From septic shock vs from ureteral stone.  -Hydrated and improved -Follow up with PCP   Nausea, vomiting and diarrhea, improved -Advanced to soft  diet  Hypomagnesemia/Hypophosphatemia/Hypokalemia:  Replaced.  Repeat as an outpatient .   Normocytic Anemia -Stable  -Follow up with PCP   Hypothyroidism:  -Resume Synthroid.   Ureteral Stone:  -Explains her lower abd pain radiating to the back.  -Patient has been evaluated by urology -Left UVJ stone with moderate hydonephrosis--pt should be able to pass 1mm stone without intervention -Added hydration and Cr improved -Also started on Flomax and will continue on D/C  Discharge Instructions  Discharge Instructions    Call MD for:  difficulty breathing, headache or visual disturbances    Complete by:  As directed    Call MD for:  extreme fatigue    Complete by:  As directed    Call MD for:  hives    Complete by:  As directed    Call MD for:  persistant dizziness or light-headedness    Complete by:  As directed    Call MD for:  persistant nausea and vomiting    Complete by:  As directed    Call MD for:  redness, tenderness, or signs of infection (pain, swelling, redness, odor or green/yellow discharge around incision site)    Complete by:  As directed    Call MD for:  severe uncontrolled pain    Complete by:  As directed    Call MD for:  temperature >100.4    Complete by:  As directed    Diet - low sodium heart healthy    Complete by:  As directed    Discharge instructions    Complete by:  As directed    Follow up with PCP. Take all medications as prescribed. If symptoms change  or worsen please return to the ED for evaluation.   Increase activity slowly    Complete by:  As directed      Allergies as of 10/05/2017      Reactions   Erythromycin Rash   "most antibiotics"   Penicillins Rash   "Most antibiotics" Has patient had a PCN reaction causing immediate rash, facial/tongue/throat swelling, SOB or lightheadedness with hypotension: Yes Has patient had a PCN reaction causing severe rash involving mucus membranes or skin necrosis: yes Has patient had a PCN  reaction that required hospitalization:No Has patient had a PCN reaction occurring within the last 10 years:No If all of the above answers are "NO", then may proceed with Cephalosporin use.   Sulfa Antibiotics Rash   "most antibiotics"      Medication List    STOP taking these medications   HYDROcodone-acetaminophen 5-325 MG tablet Commonly known as:  NORCO/VICODIN     TAKE these medications   guaiFENesin-dextromethorphan 100-10 MG/5ML syrup Commonly known as:  ROBITUSSIN DM Take 5 mLs by mouth every 4 (four) hours as needed for cough.   levothyroxine 75 MCG tablet Commonly known as:  SYNTHROID, LEVOTHROID Take 75 mcg by mouth daily before breakfast.   naproxen 500 MG tablet Commonly known as:  NAPROSYN Take 500 mg by mouth 2 (two) times daily with a meal.   ondansetron 4 MG/2ML Soln injection Commonly known as:  ZOFRAN Inject 2 mLs (4 mg total) into the vein every 6 (six) hours as needed for nausea or vomiting.   oxyCODONE 5 MG immediate release tablet Commonly known as:  Oxy IR/ROXICODONE Take 1 tablet (5 mg total) by mouth every 6 (six) hours as needed for severe pain or breakthrough pain.   phosphorus 155-852-130 MG tablet Commonly known as:  K PHOS NEUTRAL Take 2 tablets (500 mg total) by mouth 3 (three) times daily.   pravastatin 40 MG tablet Commonly known as:  PRAVACHOL Take 40 mg by mouth daily.   saccharomyces boulardii 250 MG capsule Commonly known as:  FLORASTOR Take 1 capsule (250 mg total) by mouth 2 (two) times daily.   tamsulosin 0.4 MG Caps capsule Commonly known as:  FLOMAX Take 1 capsule (0.4 mg total) by mouth daily.   tiZANidine 2 MG tablet Commonly known as:  ZANAFLEX Take one tablet by mouth twice daily.   vancomycin 50 mg/mL oral solution Commonly known as:  VANCOCIN Take 2.5 mLs (125 mg total) by mouth every 6 (six) hours.   VIVELLE-DOT 0.075 MG/24HR Generic drug:  estradiol APPLY 1 PATCH TWICE WEEKLY      Follow-up Information     Geoffry Paradise, MD. Call in 1 week(s).   Specialty:  Internal Medicine Why:  Follow up within 1 week Contact information: [address] [place] [address] [phone]        Malen Gauze, MD. Call in 1 week(s).   Specialty:  Urology Why:  Follow up within 1 week  Contact information: [address] [place] [address] [phone]          Allergies  Allergen Reactions  . Erythromycin Rash    "most antibiotics"  . Penicillins Rash    "Most antibiotics" Has patient had a PCN reaction causing immediate rash, facial/tongue/throat swelling, SOB or lightheadedness with hypotension: Yes Has patient had a PCN reaction causing severe rash involving mucus membranes or skin necrosis: yes Has patient had a PCN reaction that required hospitalization:No Has patient had a PCN reaction occurring within the last  10 years:No If all of the above answers are "NO", then may proceed with Cephalosporin use.   . Sulfa Antibiotics Rash    "most antibiotics"   Consultations:  PCCM Transfer  Urology  Procedures/Studies: Ct Abdomen Pelvis Wo Contrast  Result Date: 10/02/2017 CLINICAL DATA:  Left abdominal pain EXAM: CT ABDOMEN AND PELVIS WITHOUT CONTRAST TECHNIQUE: Multidetector CT imaging of the abdomen and pelvis was performed following the standard protocol without IV contrast. COMPARISON:  None. FINDINGS: Lower chest: There are patchy areas of consolidation at both lung bases associated with small bilateral pleural effusions. Hepatobiliary: Liver and gallbladder are unremarkable. Pancreas: Unremarkable Spleen: Unremarkable Adrenals/Urinary Tract: Moderate left hydronephrosis and perinephric stranding are associated with a more 1 mm calculus at the left ureterovesical junction. There are no renal calculi. No evidence of right hydronephrosis. Adrenal glands are unremarkable. Bladder is within normal limits. Stomach/Bowel: Stomach is decompressed. No obvious mass in the  colon. Appendix is unremarkable. No evidence of small-bowel obstruction. Mild diverticulosis of the descending and sigmoid colon. Vascular/Lymphatic: No abnormal retroperitoneal adenopathy. Atherosclerotic calcifications of the aorta and iliac vasculature are present. Reproductive: Uterus is absent.  Adnexa are within normal limits. Other: No free-fluid. Musculoskeletal: Advanced degenerative disc disease in the lumbar spine. No vertebral compression deformity. IMPRESSION: 1 mm left ureterovesical junction calculus is associated with secondary findings of left ureteral obstruction within the left kidney. Patchy bibasilar consolidation and small bilateral pleural effusions. Inflammatory process is not excluded. Electronically Signed   By: Jolaine Click M.D.   On: 10/02/2017 19:32   Dg Chest 1 View  Result Date: 09/30/2017 CLINICAL DATA:  Central line placement EXAM: CHEST 1 VIEW COMPARISON:  Chest x-ray of earlier today. FINDINGS: There has been placement of a right internal jugular venous catheter. The tip projects over the midportion of the SVC. The proximal portion of the catheter appears coiled either outside of or under the skin. There is no pneumothorax or pleural effusion. There is mild soft tissue fullness in the right paratracheal region. The left lung is clear. The heart and pulmonary vascularity are normal. IMPRESSION: No immediate postprocedure complication following right internal jugular venous catheter placement. Electronically Signed   By: David  [place] M.D.   On: 09/30/2017 16:05   US Renal  Result Date: 09/30/2017 CLINICAL DATA:  Acute renal failure. EXAM: RENAL / URINARY TRACT ULTRASOUND COMPLETE COMPARISON:  None. FINDINGS: Right Kidney: Length: 11.4 cm. Echogenicity within normal limits. No mass or hydronephrosis visualized. Left Kidney: Length: 11.3 cm. Echogenicity within normal limits. No mass or hydronephrosis visualized. Bladder: Appears normal for degree of bladder distention.  IMPRESSION: Normal size and appearance of both kidneys. No evidence of hydronephrosis. Electronically Signed   By: Myles Rosenthal M.D.   On: 09/30/2017 18:01   Dg Chest Port 1 View  Result Date: 09/30/2017 CLINICAL DATA:  Cough, weakness, chills, and nausea. Smoking history. EXAM: PORTABLE CHEST 1 VIEW COMPARISON:  None. FINDINGS: The cardiomediastinal silhouette is within normal limits. There is minimal central airway thickening. No confluent airspace opacity, edema, pleural effusion, or pneumothorax is identified. No acute osseous abnormality is seen. IMPRESSION: Minimal bronchitic changes. Electronically Signed   By: Sebastian Ache M.D.   On: 09/30/2017 09:23    ECHOCARDIOGRAM ------------------------------------------------------------------- Study Conclusions  - Left ventricle: The cavity size was normal. Wall thickness was   normal. Systolic function was normal. The estimated ejection   fraction was in the range of 60% to 65%. Wall motion was normal;   there were no regional wall  motion abnormalities. - Mitral valve: There was mild regurgitation. - Right atrium: The atrium was mildly dilated.  Subjective: Seen and examined and was feeling better. No CP or SOB. Diarrhea has improved. No lightheadedness or dizziness and was ready to go home.   Discharge Exam: Vitals:   10/05/17 0351 10/05/17 1327  BP: 128/78 124/83  Pulse: 98 86  Resp: 16 18  Temp: 98.1 F (36.7 C) 98.7 F (37.1 C)  SpO2: 94% 96%   Vitals:   10/04/17 2028 10/05/17 0351 10/05/17 0432 10/05/17 1327  BP: 123/72 128/78  124/83  Pulse: 74 98  86  Resp: Temp: 98.9 F (37.2 C) 98.1 F (36.7 C)  98.7 F (37.1 C)  TempSrc: Oral Oral  Oral  SpO2: 96% 94%  96%  Weight:   81.5 kg (179 lb 10.8 oz)   Height:       General: Pt is alert, awake, not in acute distress Cardiovascular: RRR, S1/S2 +, no rubs, no gallops Respiratory: CTA bilaterally, no wheezing, no rhonchi Abdominal: Soft, NT, ND, bowel sounds  + Extremities: no edema, no cyanosis  The results of significant diagnostics from this hospitalization (including imaging, microbiology, ancillary and laboratory) are listed below for reference.    Microbiology: Recent Results (from the past 240 hour(s))  Blood Culture (routine x 2)     Status: None   Collection Time: 09/30/17  8:40 AM  Result Value Ref Range Status   Specimen Description BLOOD BLOOD RIGHT FOREARM  Final   Special Requests   Final    BOTTLES DRAWN AEROBIC AND ANAEROBIC Blood Culture adequate volume   Culture   Final    NO GROWTH 5 DAYS Performed at [facility], [address], [place], [place] [address]    Report Status 10/05/2017 FINAL  Final  Blood Culture (routine x 2)     Status: None   Collection Time: 09/30/17  8:56 AM  Result Value Ref Range Status   Specimen Description BLOOD LEFT ANTECUBITAL  Final   Special Requests   Final    BOTTLES DRAWN AEROBIC AND ANAEROBIC Blood Culture adequate volume   Culture   Final    NO GROWTH 5 DAYS Performed at [facility], [address] [place], [place] [address]    Report Status 10/05/2017 FINAL  Final  Urine culture     Status: None   Collection Time: 09/30/17 12:49 PM  Result Value Ref Range Status   Specimen Description URINE, RANDOM  Final   Special Requests Normal  Final   Culture   Final    NO GROWTH Performed at [facility], [address] [place], [place] [address]    Report Status 10/01/2017 FINAL  Final  Respiratory Panel by PCR     Status: None   Collection Time: 09/30/17  2:53 PM  Result Value Ref Range Status   Adenovirus NOT DETECTED NOT DETECTED Final   Coronavirus 229E NOT DETECTED NOT DETECTED Final   Coronavirus HKU1 NOT DETECTED NOT DETECTED Final   Coronavirus NL63 NOT DETECTED NOT DETECTED Final   Coronavirus OC43 NOT DETECTED NOT DETECTED Final   Metapneumovirus NOT DETECTED NOT DETECTED Final   Rhinovirus / Enterovirus NOT DETECTED NOT DETECTED Final   Influenza  A NOT DETECTED NOT DETECTED Final   Influenza B NOT DETECTED NOT DETECTED Final   Parainfluenza Virus 1 NOT DETECTED NOT DETECTED Final   Parainfluenza Virus 2 NOT DETECTED NOT DETECTED Final  Parainfluenza Virus 3 NOT DETECTED NOT DETECTED Final   Parainfluenza Virus 4 NOT DETECTED NOT DETECTED Final   Respiratory Syncytial Virus NOT DETECTED NOT DETECTED Final   Bordetella pertussis NOT DETECTED NOT DETECTED Final   Chlamydophila pneumoniae NOT DETECTED NOT DETECTED Final   Mycoplasma pneumoniae NOT DETECTED NOT DETECTED Final    Comment: Performed at [facility], [address], [place], [place] [address]  Gastrointestinal Panel by PCR , Stool     Status: None   Collection Time: 09/30/17  6:54 PM  Result Value Ref Range Status   Campylobacter species NOT DETECTED NOT DETECTED Final   Plesimonas shigelloides NOT DETECTED NOT DETECTED Final   Salmonella species NOT DETECTED NOT DETECTED Final   Yersinia enterocolitica NOT DETECTED NOT DETECTED Final   Vibrio species NOT DETECTED NOT DETECTED Final   Vibrio cholerae NOT DETECTED NOT DETECTED Final   Enteroaggregative E coli (EAEC) NOT DETECTED NOT DETECTED Final   Enteropathogenic E coli (EPEC) NOT DETECTED NOT DETECTED Final   Enterotoxigenic E coli (ETEC) NOT DETECTED NOT DETECTED Final   Shiga like toxin producing E coli (STEC) NOT DETECTED NOT DETECTED Final   Shigella/Enteroinvasive E coli (EIEC) NOT DETECTED NOT DETECTED Final   Cryptosporidium NOT DETECTED NOT DETECTED Final   Cyclospora cayetanensis NOT DETECTED NOT DETECTED Final   Entamoeba histolytica NOT DETECTED NOT DETECTED Final   Giardia lamblia NOT DETECTED NOT DETECTED Final   Adenovirus F40/41 NOT DETECTED NOT DETECTED Final   Astrovirus NOT DETECTED NOT DETECTED Final   Norovirus GI/GII NOT DETECTED NOT DETECTED Final   Rotavirus A NOT DETECTED NOT DETECTED Final   Sapovirus (I, II, IV, and V) NOT DETECTED NOT DETECTED Final  C difficile quick scan w PCR  reflex     Status: Abnormal   Collection Time: 09/30/17  6:54 PM  Result Value Ref Range Status   C Diff antigen POSITIVE (A) NEGATIVE Final   C Diff toxin NEGATIVE NEGATIVE Final   C Diff interpretation Results are indeterminate. See PCR results.  Final  Clostridium Difficile by PCR     Status: Abnormal   Collection Time: 09/30/17  6:54 PM  Result Value Ref Range Status   Toxigenic C Difficile by pcr POSITIVE (A) NEGATIVE Final    Comment: Positive for toxigenic C. difficile with little to no toxin production. Only treat if clinical presentation suggests symptomatic illness. Performed at [facility], [address], [place], [place] [address]     Labs: BNP (last 3 results) No results for input(s): BNP in the last 8760 hours. Basic Metabolic Panel:  Recent Labs Lab 10/01/17 0539 10/01/17 1535 10/02/17 0358 10/02/17 0415 10/03/17 0418 10/05/17 1237  NA 141 141  --  143 144 141  K 4.0 3.4*  --  3.5 3.6 3.7  CL 114* 113*  --  114* 113* 107  CO2 19* 21*  --  23 25 26   GLUCOSE 194* 188*  --  101* 152* 152*  BUN 16 12  --  9 8 6   CREATININE 1.14* 1.05*  --  1.05* 1.21* 0.80  CALCIUM 8.2* 8.2*  --  8.0* 8.2* 8.2*  MG 1.4* 2.1 1.8  --  1.6* 1.7  PHOS 1.6* 1.9*  --  2.3* 2.2* 2.1*   Liver Function Tests:  Recent Labs Lab 09/30/17 0822 10/01/17 0539 10/01/17 1535 10/02/17 0415 10/03/17 0418 10/05/17 1237  AST 40 30  --   --   --  35  ALT  30 26  --   --   --  24  ALKPHOS 146* 85  --   --   --  125  BILITOT 1.1 0.9  --   --   --  0.5  PROT 6.8 5.6*  --   --   --  5.9*  ALBUMIN 3.7 2.8* 2.9* 2.7* 2.7* 3.0*    Recent Labs Lab 09/30/17 0822 09/30/17 1650  LIPASE 23  --   AMYLASE  --  25*   No results for input(s): AMMONIA in the last 168 hours. CBC:  Recent Labs Lab 09/30/17 0822 09/30/17 1650 10/01/17 0539 10/01/17 1050 10/02/17 0358 10/03/17 0418 10/05/17 0326  WBC 2.4* 18.5* 33.0*  --  26.1* 20.1* 10.4  NEUTROABS 2.0  --  29.7*  --  22.2* 17.1*   --   HGB 15.0 11.9* 11.7*  --  11.3* 11.4* 11.9*  HCT 44.7 36.1 35.5*  --  35.2* 35.2* 37.1  MCV 88.2 89.6 89.0  --  90.0 88.7 90.5  PLT 189 168 165 154 133* 130* 168   Cardiac Enzymes:  Recent Labs Lab 09/30/17 1650  TROPONINI 0.06*   BNP: Invalid input(s): POCBNP CBG: No results for input(s): GLUCAP in the last 168 hours. D-Dimer No results for input(s): DDIMER in the last 72 hours. Hgb A1c No results for input(s): HGBA1C in the last 72 hours. Lipid Profile No results for input(s): CHOL, HDL, LDLCALC, TRIG, CHOLHDL, LDLDIRECT in the last 72 hours. Thyroid function studies No results for input(s): TSH, T4TOTAL, T3FREE, THYROIDAB in the last 72 hours.  Invalid input(s): FREET3 Anemia work up No results for input(s): VITAMINB12, FOLATE, FERRITIN, TIBC, IRON, RETICCTPCT in the last 72 hours. Urinalysis    Component Value Date/Time   COLORURINE AMBER (A) 09/30/2017 1249   APPEARANCEUR HAZY (A) 09/30/2017 1249   LABSPEC 1.015 09/30/2017 1249   PHURINE 5.0 09/30/2017 1249   GLUCOSEU NEGATIVE 09/30/2017 1249   HGBUR MODERATE (A) 09/30/2017 1249   BILIRUBINUR NEGATIVE 09/30/2017 1249   KETONESUR NEGATIVE 09/30/2017 1249   PROTEINUR 100 (A) 09/30/2017 1249   UROBILINOGEN 0.2 02/24/2009 1315   NITRITE NEGATIVE 09/30/2017 1249   LEUKOCYTESUR LARGE (A) 09/30/2017 1249   Sepsis Labs Invalid input(s): PROCALCITONIN,  WBC,  LACTICIDVEN Microbiology Recent Results (from the past 240 hour(s))  Blood Culture (routine x 2)     Status: None   Collection Time: 09/30/17  8:40 AM  Result Value Ref Range Status   Specimen Description BLOOD BLOOD RIGHT FOREARM  Final   Special Requests   Final    BOTTLES DRAWN AEROBIC AND ANAEROBIC Blood Culture adequate volume   Culture   Final    NO GROWTH 5 DAYS Performed at [facility], [address] [place], [place] [address]    Report Status 10/05/2017 FINAL  Final  Blood Culture (routine x 2)     Status: None   Collection Time:  09/30/17  8:56 AM  Result Value Ref Range Status   Specimen Description BLOOD LEFT ANTECUBITAL  Final   Special Requests   Final    BOTTLES DRAWN AEROBIC AND ANAEROBIC Blood Culture adequate volume   Culture   Final    NO GROWTH 5 DAYS Performed at [facility], [address], [place], [place] [address]    Report Status 10/05/2017 FINAL  Final  Urine culture     Status: None   Collection Time: 09/30/17 12:49 PM  Result Value Ref Range Status   Specimen  Description URINE, RANDOM  Final   Special Requests Normal  Final   Culture   Final    NO GROWTH Performed at [facility], [address] [place], [place] [address]    Report Status 10/01/2017 FINAL  Final  Respiratory Panel by PCR     Status: None   Collection Time: 09/30/17  2:53 PM  Result Value Ref Range Status   Adenovirus NOT DETECTED NOT DETECTED Final   Coronavirus 229E NOT DETECTED NOT DETECTED Final   Coronavirus HKU1 NOT DETECTED NOT DETECTED Final   Coronavirus NL63 NOT DETECTED NOT DETECTED Final   Coronavirus OC43 NOT DETECTED NOT DETECTED Final   Metapneumovirus NOT DETECTED NOT DETECTED Final   Rhinovirus / Enterovirus NOT DETECTED NOT DETECTED Final   Influenza A NOT DETECTED NOT DETECTED Final   Influenza B NOT DETECTED NOT DETECTED Final   Parainfluenza Virus 1 NOT DETECTED NOT DETECTED Final   Parainfluenza Virus 2 NOT DETECTED NOT DETECTED Final   Parainfluenza Virus 3 NOT DETECTED NOT DETECTED Final   Parainfluenza Virus 4 NOT DETECTED NOT DETECTED Final   Respiratory Syncytial Virus NOT DETECTED NOT DETECTED Final   Bordetella pertussis NOT DETECTED NOT DETECTED Final   Chlamydophila pneumoniae NOT DETECTED NOT DETECTED Final   Mycoplasma pneumoniae NOT DETECTED NOT DETECTED Final    Comment: Performed at [facility], [address], [place], [place] [address]  Gastrointestinal Panel by PCR , Stool     Status: None   Collection Time: 09/30/17  6:54 PM  Result Value Ref Range Status    Campylobacter species NOT DETECTED NOT DETECTED Final   Plesimonas shigelloides NOT DETECTED NOT DETECTED Final   Salmonella species NOT DETECTED NOT DETECTED Final   Yersinia enterocolitica NOT DETECTED NOT DETECTED Final   Vibrio species NOT DETECTED NOT DETECTED Final   Vibrio cholerae NOT DETECTED NOT DETECTED Final   Enteroaggregative E coli (EAEC) NOT DETECTED NOT DETECTED Final   Enteropathogenic E coli (EPEC) NOT DETECTED NOT DETECTED Final   Enterotoxigenic E coli (ETEC) NOT DETECTED NOT DETECTED Final   Shiga like toxin producing E coli (STEC) NOT DETECTED NOT DETECTED Final   Shigella/Enteroinvasive E coli (EIEC) NOT DETECTED NOT DETECTED Final   Cryptosporidium NOT DETECTED NOT DETECTED Final   Cyclospora cayetanensis NOT DETECTED NOT DETECTED Final   Entamoeba histolytica NOT DETECTED NOT DETECTED Final   Giardia lamblia NOT DETECTED NOT DETECTED Final   Adenovirus F40/41 NOT DETECTED NOT DETECTED Final   Astrovirus NOT DETECTED NOT DETECTED Final   Norovirus GI/GII NOT DETECTED NOT DETECTED Final   Rotavirus A NOT DETECTED NOT DETECTED Final   Sapovirus (I, II, IV, and V) NOT DETECTED NOT DETECTED Final  C difficile quick scan w PCR reflex     Status: Abnormal   Collection Time: 09/30/17  6:54 PM  Result Value Ref Range Status   C Diff antigen POSITIVE (A) NEGATIVE Final   C Diff toxin NEGATIVE NEGATIVE Final   C Diff interpretation Results are indeterminate. See PCR results.  Final  Clostridium Difficile by PCR     Status: Abnormal   Collection Time: 09/30/17  6:54 PM  Result Value Ref Range Status   Toxigenic C Difficile by pcr POSITIVE (A) NEGATIVE Final    Comment: Positive for toxigenic C. difficile with little to no toxin production. Only treat if clinical presentation suggests symptomatic illness. Performed at [facility], [address], [place], [place] [address]  Time coordinating discharge: 35 minutes  SIGNED:  Merlene Laughter, DO [facility] 10/05/2017, 1:47 PM Pager [phone]  If 7PM-7AM, please contact night-coverage [email or web address] Password [email or web address]

## 2017-10-05 NOTE — Progress Notes (Signed)
Order received to discontinue R TL IJ. Four sutures removed. Vasoline gauze placed with line removal. Tip intact. Pressure held for five minutes. Dressing CDI after pressure held and without complications.

## 2017-10-05 NOTE — Progress Notes (Signed)
Patient is alert, oriented x4, ambulatory without assist. IJ site clean,dry, intact. Site has no drainage. Pt rested for 30 mins after pulled. Questions, concerns denied. Prescriptions given additional were called into pharmacy.

## 2017-10-31 ENCOUNTER — Other Ambulatory Visit: Payer: Self-pay | Admitting: Internal Medicine

## 2017-10-31 DIAGNOSIS — M5416 Radiculopathy, lumbar region: Secondary | ICD-10-CM

## 2017-11-08 ENCOUNTER — Ambulatory Visit
Admission: RE | Admit: 2017-11-08 | Discharge: 2017-11-08 | Disposition: A | Discharge status: Home / Self Care | Payer: Medicare Other | Source: Ambulatory Visit | Attending: Internal Medicine | Admitting: Internal Medicine

## 2017-11-08 DIAGNOSIS — M5416 Radiculopathy, lumbar region: Secondary | ICD-10-CM

## 2017-11-08 MED ORDER — METHYLPREDNISOLONE ACETATE 40 MG/ML INJ SUSP (RADIOLOG: 120.0000 mg | Freq: Once | INTRAMUSCULAR | Status: DC

## 2017-11-08 MED ORDER — IOPAMIDOL (ISOVUE-M 200) INJECTION 41%
1.0000 mL | Freq: Once | INTRAMUSCULAR | Status: AC
  Administered 2017-11-08: 1 mL via EPIDURAL

## 2017-11-08 MED ORDER — METHYLPREDNISOLONE ACETATE 40 MG/ML INJ SUSP (RADIOLOG
120.0000 mg | Freq: Once | INTRAMUSCULAR | Status: AC
  Administered 2017-11-08: 120 mg via EPIDURAL

## 2017-11-08 MED ORDER — IOPAMIDOL (ISOVUE-M 200) INJECTION 41%: 1.0000 mL | Freq: Once | INTRAMUSCULAR | Status: DC

## 2018-02-20 ENCOUNTER — Other Ambulatory Visit: Payer: Self-pay | Admitting: Internal Medicine

## 2018-02-20 DIAGNOSIS — M546 Pain in thoracic spine: Secondary | ICD-10-CM

## 2018-02-24 ENCOUNTER — Other Ambulatory Visit: Payer: Self-pay | Admitting: Podiatry

## 2018-02-24 ENCOUNTER — Encounter: Payer: Self-pay | Admitting: Podiatry

## 2018-02-24 ENCOUNTER — Ambulatory Visit: Payer: Medicare Other | Admitting: Podiatry

## 2018-02-24 ENCOUNTER — Ambulatory Visit (INDEPENDENT_AMBULATORY_CARE_PROVIDER_SITE_OTHER): Payer: Medicare Other

## 2018-02-24 VITALS — BP 132/79 | HR 79 | Resp 16

## 2018-02-24 DIAGNOSIS — M7750 Other enthesopathy of unspecified foot: Secondary | ICD-10-CM

## 2018-02-24 DIAGNOSIS — M779 Enthesopathy, unspecified: Secondary | ICD-10-CM

## 2018-02-24 DIAGNOSIS — M898X9 Other specified disorders of bone, unspecified site: Secondary | ICD-10-CM

## 2018-02-24 DIAGNOSIS — M205X2 Other deformities of toe(s) (acquired), left foot: Secondary | ICD-10-CM | POA: Diagnosis not present

## 2018-02-24 DIAGNOSIS — Q828 Other specified congenital malformations of skin: Secondary | ICD-10-CM | POA: Diagnosis not present

## 2018-02-24 NOTE — Progress Notes (Signed)
Subjective:    Patient ID: Rance MuirLaura D Boulais, female    DOB: 11/17/1950, 68 y.o.   MRN: [id number]  HPI 68 year old female presents the office today for 2 concerns.  The left foot her main concern is to the fifth toe she is a painful area on the side of her fifth toe in between her fourth and fifth toe which is painful with pressure in shoes.  This is been ongoing for several mon some time and she said no recent treatment for this.  She also states that she has pain to the outside aspect of the right foot describes an aching sensation.  She has a history of a sprained ankle several months ago.  She said no recent treatment for this and she has not had any swelling.  She is able to a regular shoe and the pain is intermittent.  She has no other concerns today.   Review of Systems  Musculoskeletal: Positive for arthralgias.  All other systems reviewed and are negative.  Past Medical History:  Diagnosis Date  . Arthritis   . Hypercholesterolemia   . Hypothyroidism     Past Surgical History:  Procedure Laterality Date  . TONSILLECTOMY  1971  . VAGINAL HYSTERECTOMY  1994     Current Outpatient Medications:  .  HYDROcodone-acetaminophen (NORCO/VICODIN) 5-325 MG tablet, Take 1 tablet by mouth every 6 (six) hours as needed. for pain, Disp: , Rfl: 0 .  levothyroxine (SYNTHROID, LEVOTHROID) 75 MCG tablet, Take 75 mcg by mouth daily before breakfast., Disp: , Rfl:  .  naproxen (NAPROSYN) 500 MG tablet, Take 500 mg by mouth 2 (two) times daily with a meal., Disp: , Rfl:  .  pravastatin (PRAVACHOL) 40 MG tablet, Take 40 mg by mouth daily. , Disp: , Rfl:  .  tiZANidine (ZANAFLEX) 2 MG tablet, Take one tablet by mouth twice daily., Disp: , Rfl:  .  VIVELLE-DOT 0.075 MG/24HR, APPLY 1 PATCH TWICE WEEKLY, Disp: 8 each, Rfl: 0  Allergies  Allergen Reactions  . Erythromycin Rash    "most antibiotics"  . Penicillins Rash    "Most antibiotics" Has patient had a PCN reaction causing immediate rash,  facial/tongue/throat swelling, SOB or lightheadedness with hypotension: Yes Has patient had a PCN reaction causing severe rash involving mucus membranes or skin necrosis: yes Has patient had a PCN reaction that required hospitalization:No Has patient had a PCN reaction occurring within the last 10 years:No If all of the above answers are "NO", then may proceed with Cephalosporin use.   . Sulfa Antibiotics Rash    "most antibiotics"    Social History   Socioeconomic History  . Marital status: Married    Spouse name: Not on file  . Number of children: Not on file  . Years of education: Not on file  . Highest education level: Not on file  Social Needs  . Financial resource strain: Not on file  . Food insecurity - worry: Not on file  . Food insecurity - inability: Not on file  . Transportation needs - medical: Not on file  . Transportation needs - non-medical: Not on file  Occupational History  . Not on file  Tobacco Use  . Smoking status: Current Every Day Smoker    Packs/day: 1.00    Types: Cigarettes    Start date: 11/25/1971  . Smokeless tobacco: Never Used  Substance and Sexual Activity  . Alcohol use: Not on file  . Drug use: Not on file  . Sexual activity:  Not on file  Other Topics Concern  . Not on file  Social History Narrative  . Not on file        Objective:   Physical Exam  General: AAO x3, NAD  Dermatological: Hyperkeratotic lesion present on the medial aspect the left fifth DIPJ.  Upon debridement the area is pre-ulcerative but there is no underlying ulceration, drainage or any clinical signs of infection noted.  No other open lesions or pre-ulcerative lesion.  Vascular: Dorsalis Pedis artery and Posterior Tibial artery pedal pulses are 2/4 bilateral with immedate capillary fill time. There is no pain with calf compression, swelling, warmth, erythema.   Neruologic: Grossly intact via light touch bilateral. Protective threshold with Semmes Wienstein  monofilament intact to all pedal sites bilateral.   Musculoskeletal: There is tenderness to the right foot along the fourth interspace and more along the fourth and fifth metatarsal cuboid joint.  There is no pain to vibratory sensation there is no overlying edema, erythema, increase in warmth.  No pain on the peroneal tendons.  On the left foot there is tenderness palpation of the hyperkeratotic lesion of the fifth toe but there is no other area of tenderness identified today.  No swelling bilaterally.  Muscular strength 5/5 in all groups tested bilateral.  Mild decrease in medial arch upon weightbearing.  Adductovarus is present to the fourth and fifth toes.  Gait: Unassisted, Nonantalgic.     Assessment & Plan:  68 year old female with hyperkeratotic lesion left fifth toe, right foot capsulitis/tendonitis -Treatment options discussed including all alternatives, risks, and complications -Etiology of symptoms were discussed -X-rays were obtained and reviewed with the patient.  No definitive evidence of acute fracture or stress fracture.  Adductovarus is present at the toes.  On the left foot -I sharply debrided the hyperkeratotic lesion without any complications or bleeding.  Discussed likely recurrence of I dispensed offloading pads to see if this will help. -On the right foot discussed a steroid injection she wished to proceed with this.  See procedure note below.  Ice elevation.  Anti-inflammatories as needed. -We discussed the change in shoes as possibly insert as well.  Procedure: Injection right 4th interspace, proximal Discussed alternatives, risks, complications and verbal consent was obtained.  Location: Right foot  Skin Prep: Betadine. Injectate: 0.5 cc 0.5% marcaine plain, 0.5 cc 0.5% Marcaine plain and, 1 cc kenalog 10. Disposition: Patient tolerated procedure well. Injection site dressed with a band-aid.  Post-injection care was discussed and return precautions discussed.    Vivi Barrack DPM

## 2018-02-25 ENCOUNTER — Ambulatory Visit
Admission: RE | Admit: 2018-02-25 | Discharge: 2018-02-25 | Disposition: A | Discharge status: Home / Self Care | Payer: Medicare Other | Source: Ambulatory Visit | Attending: Internal Medicine | Admitting: Internal Medicine

## 2018-02-25 DIAGNOSIS — M546 Pain in thoracic spine: Secondary | ICD-10-CM

## 2018-03-03 DIAGNOSIS — M51369 Other intervertebral disc degeneration, lumbar region without mention of lumbar back pain or lower extremity pain: Secondary | ICD-10-CM | POA: Insufficient documentation

## 2018-03-03 DIAGNOSIS — M5136 Other intervertebral disc degeneration, lumbar region: Secondary | ICD-10-CM | POA: Insufficient documentation

## 2018-03-26 DIAGNOSIS — G8929 Other chronic pain: Secondary | ICD-10-CM | POA: Insufficient documentation

## 2018-03-26 DIAGNOSIS — M7918 Myalgia, other site: Secondary | ICD-10-CM | POA: Insufficient documentation

## 2018-03-26 DIAGNOSIS — M546 Pain in thoracic spine: Secondary | ICD-10-CM

## 2018-04-07 ENCOUNTER — Ambulatory Visit (INDEPENDENT_AMBULATORY_CARE_PROVIDER_SITE_OTHER): Payer: Medicare Other | Admitting: Podiatry

## 2018-04-07 ENCOUNTER — Encounter: Payer: Self-pay | Admitting: Podiatry

## 2018-04-07 DIAGNOSIS — M7751 Other enthesopathy of right foot: Secondary | ICD-10-CM

## 2018-04-07 DIAGNOSIS — M7750 Other enthesopathy of unspecified foot: Secondary | ICD-10-CM

## 2018-04-11 NOTE — Progress Notes (Signed)
Subjective: Vernona RiegerLaura presents the office today for follow-up evaluation of pain to her left fifth toe as well as the top of her right foot.  She states that the injection of the right foot helped significantly no pain is resolved she still gets some pain in the top of her right foot.  Also the spacer to the left foot is been helping quite a bit.  No recent injury or falls no trauma. Denies any systemic complaints such as fevers, chills, nausea, vomiting. No acute changes since last appointment, and no other complaints at this time.   Objective: AAO x3, NAD DP/PT pulses palpable bilaterally, CRT less than 3 seconds There is no tenderness palpation to the interspace of the right foot however there is tenderness to the dorsal aspect of the foot on the midfoot on the more lateral aspect.  This appears to be just inferior to the area of the sinus tarsi.  There is no edema, erythema, increase in warmth. No open lesions or pre-ulcerative lesions.  No pain with calf compression, swelling, warmth, erythema  Assessment: Capsulitis/tendonitis right foot  Plan: -All treatment options discussed with the patient including all alternatives, risks, complications.  -At this time she wished to proceed with a steroid injection to this area and the top of the foot which is painful.  Discussed risks and complications of injections.  Area was prepped with alcohol and then a mixture of 1 cc of Kenalog 10, 0.5 cc of 0.5% Marcaine plain and 0.5 cc of 2% lidocaine plain was infiltrated into the area of maximal tenderness without complications.  Postinjection care was discussed. -Continue spacer for the left foot -Patient encouraged to call the office with any questions, concerns, change in symptoms.   Vivi BarrackMatthew R Wagoner DPM

## 2018-04-22 ENCOUNTER — Other Ambulatory Visit: Payer: Self-pay | Admitting: Chiropractic Medicine

## 2018-04-22 DIAGNOSIS — M546 Pain in thoracic spine: Secondary | ICD-10-CM

## 2018-04-28 ENCOUNTER — Ambulatory Visit
Admission: RE | Admit: 2018-04-28 | Discharge: 2018-04-28 | Disposition: A | Discharge status: Home / Self Care | Payer: Medicare Other | Source: Ambulatory Visit | Attending: Chiropractic Medicine | Admitting: Chiropractic Medicine

## 2018-04-28 DIAGNOSIS — M546 Pain in thoracic spine: Secondary | ICD-10-CM

## 2018-04-29 ENCOUNTER — Other Ambulatory Visit: Payer: Self-pay | Admitting: Chiropractic Medicine

## 2018-04-29 DIAGNOSIS — M546 Pain in thoracic spine: Secondary | ICD-10-CM

## 2018-05-08 ENCOUNTER — Ambulatory Visit
Admission: RE | Admit: 2018-05-08 | Discharge: 2018-05-08 | Disposition: A | Discharge status: Home / Self Care | Payer: Medicare Other | Source: Ambulatory Visit | Attending: Chiropractic Medicine | Admitting: Chiropractic Medicine

## 2018-05-08 DIAGNOSIS — M546 Pain in thoracic spine: Secondary | ICD-10-CM

## 2018-05-08 MED ORDER — TRIAMCINOLONE ACETONIDE 40 MG/ML IJ SUSP (RADIOLOGY)
60.0000 mg | Freq: Once | INTRAMUSCULAR | Status: AC
  Administered 2018-05-08: 60 mg via EPIDURAL

## 2018-05-08 MED ORDER — IOPAMIDOL (ISOVUE-M 300) INJECTION 61%
1.0000 mL | Freq: Once | INTRAMUSCULAR | Status: AC | PRN
  Administered 2018-05-08: 1 mL via EPIDURAL

## 2018-05-08 NOTE — Discharge Instructions (Signed)

## 2018-06-02 ENCOUNTER — Other Ambulatory Visit: Payer: Self-pay | Admitting: Internal Medicine

## 2018-06-02 DIAGNOSIS — M48061 Spinal stenosis, lumbar region without neurogenic claudication: Secondary | ICD-10-CM

## 2018-06-16 ENCOUNTER — Ambulatory Visit (INDEPENDENT_AMBULATORY_CARE_PROVIDER_SITE_OTHER): Payer: Medicare Other | Admitting: Podiatry

## 2018-06-16 ENCOUNTER — Other Ambulatory Visit: Payer: Self-pay | Admitting: Internal Medicine

## 2018-06-16 DIAGNOSIS — M779 Enthesopathy, unspecified: Secondary | ICD-10-CM

## 2018-06-16 DIAGNOSIS — M7751 Other enthesopathy of right foot: Secondary | ICD-10-CM

## 2018-06-16 DIAGNOSIS — M7750 Other enthesopathy of unspecified foot: Secondary | ICD-10-CM

## 2018-06-16 DIAGNOSIS — M546 Pain in thoracic spine: Secondary | ICD-10-CM

## 2018-06-16 MED ORDER — DICLOFENAC SODIUM 1 % TD GEL: 2.0000 g | Freq: Four times a day (QID) | TRANSDERMAL | 2 refills | Status: AC

## 2018-06-16 MED ORDER — TRIAMCINOLONE ACETONIDE 10 MG/ML IJ SUSP
10.0000 mg | Freq: Once | INTRAMUSCULAR | Status: AC
  Administered 2018-06-16: 10 mg

## 2018-06-16 NOTE — Patient Instructions (Signed)
Look at getting a "powerstep" or "superfeet" insert for your shoes.  Have a great trip

## 2018-06-17 NOTE — Progress Notes (Signed)
Subjective: 68 year old female presents the office today for follow-up evaluation of right foot.  She says the foot did get much better however a week ago she started to get increasing pain in the area.  She states the injection helped quite a bit without any side effects.  She also states the pads to help on the left fifth toe.  She denies any increase in swelling or any recent injury or trauma to her feet.  She has no other concerns. Denies any systemic complaints such as fevers, chills, nausea, vomiting. No acute changes since last appointment, and no other complaints at this time.   Objective: AAO x3, NAD DP/PT pulses palpable bilaterally, CRT less than 3 seconds There is mild discomfort on the dorsal lateral aspect of the patient's right foot along the Lisfranc joint.  There is no area pinpoint bony tenderness or pain to vibratory sensation.  There is no edema, erythema, increase in warmth.  Mild decrease in medial arch height.  No other areas of tenderness. No open lesions or pre-ulcerative lesions.  No pain with calf compression, swelling, warmth, erythema  Assessment: 68 year old female capsulitis right foot  Plan: -All treatment options discussed with the patient including all alternatives, risks, complications.  -Discussed a steroid injection she wishes to proceed with this as she has an upcoming vacation planned.  This was performed.  See procedure note below.  Also we discussed change in shoes as well as inserts were inside of her shoes.  She continue with naproxen as needed but also Voltaren gel. -Patient encouraged to call the office with any questions, concerns, change in symptoms.   Procedure: Injection dorsal lateral right midfoot Discussed alternatives, risks, complications and verbal consent was obtained.  Location: Right foot Skin Prep: Betadine. Injectate: 0.5cc 0.5% marcaine plain, 0.5 cc 2% lidocaine plain and, 1 cc kenalog 10. Disposition: Patient tolerated procedure  well. Injection site dressed with a band-aid.  Post-injection care was discussed and return precautions discussed.   Vivi BarrackMatthew R Wagoner DPM

## 2018-06-18 ENCOUNTER — Ambulatory Visit
Admission: RE | Admit: 2018-06-18 | Discharge: 2018-06-18 | Disposition: A | Discharge status: Home / Self Care | Payer: Medicare Other | Source: Ambulatory Visit | Attending: Internal Medicine | Admitting: Internal Medicine

## 2018-06-18 DIAGNOSIS — M546 Pain in thoracic spine: Secondary | ICD-10-CM

## 2018-06-18 MED ORDER — IOPAMIDOL (ISOVUE-M 200) INJECTION 41%
1.0000 mL | Freq: Once | INTRAMUSCULAR | Status: AC
  Administered 2018-06-18: 1 mL via EPIDURAL

## 2018-06-18 MED ORDER — METHYLPREDNISOLONE ACETATE 40 MG/ML INJ SUSP (RADIOLOG
120.0000 mg | Freq: Once | INTRAMUSCULAR | Status: AC
  Administered 2018-06-18: 120 mg via EPIDURAL

## 2018-06-19 ENCOUNTER — Other Ambulatory Visit: Payer: Self-pay | Admitting: Internal Medicine

## 2018-06-19 DIAGNOSIS — M48061 Spinal stenosis, lumbar region without neurogenic claudication: Secondary | ICD-10-CM

## 2018-07-08 ENCOUNTER — Ambulatory Visit
Admission: RE | Admit: 2018-07-08 | Discharge: 2018-07-08 | Disposition: A | Discharge status: Home / Self Care | Payer: Medicare Other | Source: Ambulatory Visit | Attending: Internal Medicine | Admitting: Internal Medicine

## 2018-07-08 DIAGNOSIS — M48061 Spinal stenosis, lumbar region without neurogenic claudication: Secondary | ICD-10-CM

## 2018-07-08 MED ORDER — METHYLPREDNISOLONE ACETATE 40 MG/ML INJ SUSP (RADIOLOG
120.0000 mg | Freq: Once | INTRAMUSCULAR | Status: AC
  Administered 2018-07-08: 120 mg via EPIDURAL

## 2018-07-08 MED ORDER — IOPAMIDOL (ISOVUE-M 200) INJECTION 41%
1.0000 mL | Freq: Once | INTRAMUSCULAR | Status: AC
  Administered 2018-07-08: 1 mL via EPIDURAL

## 2018-10-10 ENCOUNTER — Other Ambulatory Visit: Payer: Self-pay | Admitting: Family Medicine

## 2018-10-10 DIAGNOSIS — M4726 Other spondylosis with radiculopathy, lumbar region: Secondary | ICD-10-CM

## 2018-11-10 ENCOUNTER — Other Ambulatory Visit: Payer: Self-pay | Admitting: Internal Medicine

## 2018-11-10 DIAGNOSIS — M4726 Other spondylosis with radiculopathy, lumbar region: Secondary | ICD-10-CM

## 2018-11-12 ENCOUNTER — Ambulatory Visit
Admission: RE | Admit: 2018-11-12 | Discharge: 2018-11-12 | Disposition: A | Discharge status: Home / Self Care | Payer: Medicare Other | Source: Ambulatory Visit | Attending: Family Medicine | Admitting: Family Medicine

## 2018-11-12 DIAGNOSIS — M4726 Other spondylosis with radiculopathy, lumbar region: Secondary | ICD-10-CM

## 2018-11-12 MED ORDER — METHYLPREDNISOLONE ACETATE 40 MG/ML INJ SUSP (RADIOLOG
120.0000 mg | Freq: Once | INTRAMUSCULAR | Status: AC
  Administered 2018-11-12: 120 mg via EPIDURAL

## 2018-11-12 MED ORDER — IOPAMIDOL (ISOVUE-M 200) INJECTION 41%
1.0000 mL | Freq: Once | INTRAMUSCULAR | Status: AC
  Administered 2018-11-12: 1 mL via EPIDURAL

## 2018-12-18 ENCOUNTER — Other Ambulatory Visit: Payer: Self-pay | Admitting: Internal Medicine

## 2018-12-18 DIAGNOSIS — M546 Pain in thoracic spine: Secondary | ICD-10-CM

## 2019-03-07 IMAGING — XA Imaging study
2 series · 2 of 2 positions shown · non-contrast
Comparison: none

CLINICAL DATA: Lumbosacral spondylosis without myelopathy. LEFT leg
symptoms.

[Series 2: ortho standard · 1 of 1 slices shown (1 of 2)]
[im 1/1]
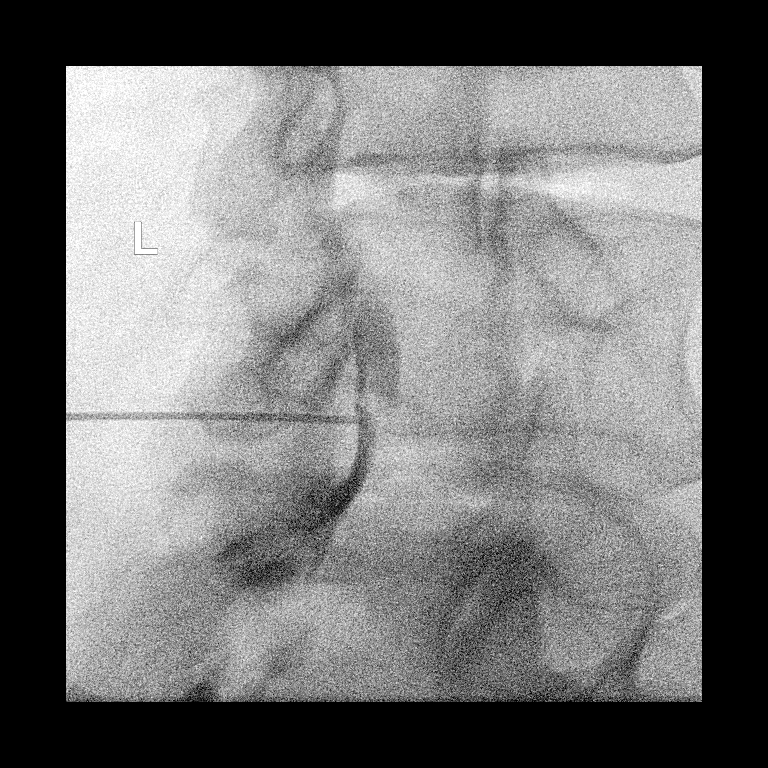

[Series 3: ortho standard · 1 of 1 slices shown (2 of 2)]
[im 1/1]
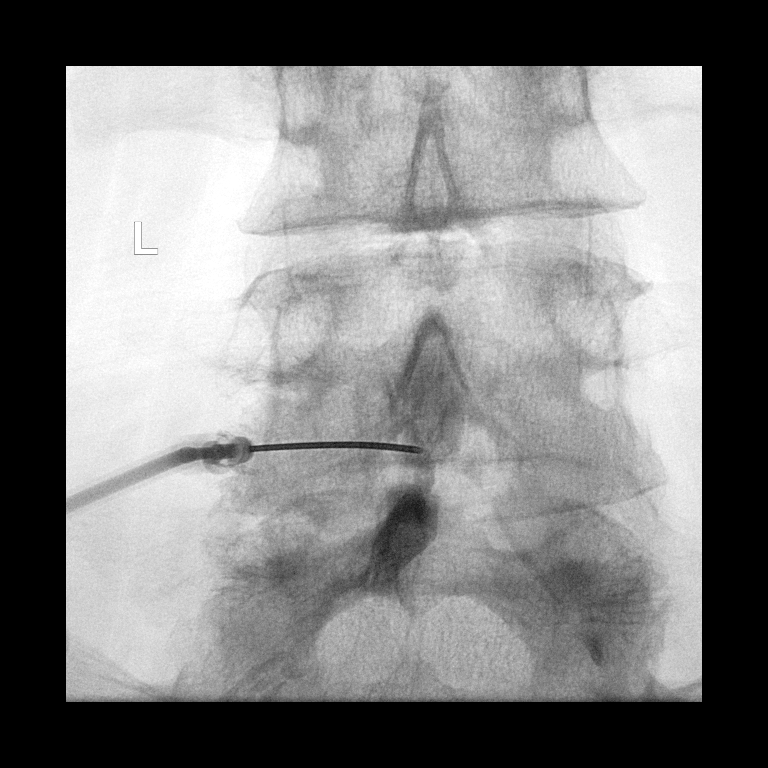

[2 of 2 positions shown; findings below may reference images not displayed]

FLUOROSCOPY TIME:  16 seconds corresponding to a Dose Area Product
of 19.71 Gy*m2

PROCEDURE:
The procedure, risks, benefits, and alternatives were explained to
the patient. Questions regarding the procedure were encouraged and
answered. The patient understands and consents to the procedure.

LUMBAR EPIDURAL INJECTION:

An interlaminar approach was performed on LEFT at L4-5. The
overlying skin was cleansed and anesthetized. A 20 gauge epidural
needle was advanced using loss-of-resistance technique.

DIAGNOSTIC EPIDURAL INJECTION:

Injection of Isovue-M 200 shows a good epidural pattern with spread
above and below the level of needle placement, primarily on the
LEFT; no vascular opacification is seen.

THERAPEUTIC EPIDURAL INJECTION:

120.0 mg of Depo-Medrol mixed with 2 mL 1% lidocaine were instilled.
The procedure was well-tolerated, and the patient was discharged
thirty minutes following the injection in good condition.

COMPLICATIONS:
None
IMPRESSION: Technically successful epidural injection on the LEFT L4-5 # 2.

## 2019-06-23 ENCOUNTER — Other Ambulatory Visit: Payer: Self-pay | Admitting: Internal Medicine

## 2019-06-23 DIAGNOSIS — M546 Pain in thoracic spine: Secondary | ICD-10-CM

## 2019-06-29 ENCOUNTER — Ambulatory Visit
Admission: RE | Admit: 2019-06-29 | Discharge: 2019-06-29 | Disposition: A | Discharge status: Home / Self Care | Payer: Medicare Other | Source: Ambulatory Visit | Attending: Internal Medicine | Admitting: Internal Medicine

## 2019-06-29 ENCOUNTER — Other Ambulatory Visit: Payer: Self-pay

## 2019-06-29 DIAGNOSIS — M546 Pain in thoracic spine: Secondary | ICD-10-CM

## 2019-06-29 MED ORDER — TRIAMCINOLONE ACETONIDE 40 MG/ML IJ SUSP (RADIOLOGY): 60.0000 mg | Freq: Once | INTRAMUSCULAR | Status: DC

## 2019-06-29 MED ORDER — METHYLPREDNISOLONE ACETATE 40 MG/ML INJ SUSP (RADIOLOG: 120.0000 mg | Freq: Once | INTRAMUSCULAR | Status: DC

## 2019-06-29 MED ORDER — IOPAMIDOL (ISOVUE-M 300) INJECTION 61%
1.0000 mL | Freq: Once | INTRAMUSCULAR | Status: AC | PRN
  Administered 2019-06-29: 1 mL via EPIDURAL

## 2019-06-29 NOTE — Discharge Instructions (Signed)

## 2020-01-15 ENCOUNTER — Other Ambulatory Visit: Payer: Self-pay | Admitting: Internal Medicine

## 2020-01-15 DIAGNOSIS — M4726 Other spondylosis with radiculopathy, lumbar region: Secondary | ICD-10-CM

## 2020-01-20 ENCOUNTER — Other Ambulatory Visit: Payer: Self-pay

## 2020-01-20 ENCOUNTER — Ambulatory Visit
Admission: RE | Admit: 2020-01-20 | Discharge: 2020-01-20 | Disposition: A | Discharge status: Home / Self Care | Payer: Medicare PPO | Source: Ambulatory Visit | Attending: Internal Medicine | Admitting: Internal Medicine

## 2020-01-20 DIAGNOSIS — M4726 Other spondylosis with radiculopathy, lumbar region: Secondary | ICD-10-CM

## 2020-01-20 MED ORDER — METHYLPREDNISOLONE ACETATE 40 MG/ML INJ SUSP (RADIOLOG
120.0000 mg | Freq: Once | INTRAMUSCULAR | Status: AC
  Administered 2020-01-20: 120 mg via EPIDURAL

## 2020-01-20 MED ORDER — IOPAMIDOL (ISOVUE-M 200) INJECTION 41%
1.0000 mL | Freq: Once | INTRAMUSCULAR | Status: AC
  Administered 2020-01-20: 08:00:00 1 mL via EPIDURAL

## 2020-01-20 NOTE — Discharge Instructions (Signed)

## 2020-04-13 ENCOUNTER — Other Ambulatory Visit: Payer: Self-pay | Admitting: Internal Medicine

## 2020-04-13 DIAGNOSIS — M48061 Spinal stenosis, lumbar region without neurogenic claudication: Secondary | ICD-10-CM

## 2020-04-26 NOTE — Discharge Instructions (Signed)

## 2020-04-27 ENCOUNTER — Ambulatory Visit
Admission: RE | Admit: 2020-04-27 | Discharge: 2020-04-27 | Disposition: A | Discharge status: Home / Self Care | Payer: Medicare PPO | Source: Ambulatory Visit | Attending: Internal Medicine | Admitting: Internal Medicine

## 2020-04-27 ENCOUNTER — Other Ambulatory Visit: Payer: Self-pay

## 2020-04-27 DIAGNOSIS — M48061 Spinal stenosis, lumbar region without neurogenic claudication: Secondary | ICD-10-CM

## 2020-04-27 MED ORDER — METHYLPREDNISOLONE ACETATE 40 MG/ML INJ SUSP (RADIOLOG
120.0000 mg | Freq: Once | INTRAMUSCULAR | Status: AC
  Administered 2020-04-27: 120 mg via EPIDURAL

## 2020-04-27 MED ORDER — IOPAMIDOL (ISOVUE-M 200) INJECTION 41%
1.0000 mL | Freq: Once | INTRAMUSCULAR | Status: AC
  Administered 2020-04-27: 1 mL via EPIDURAL

## 2020-07-18 DIAGNOSIS — R05 Cough: Secondary | ICD-10-CM | POA: Diagnosis not present

## 2020-07-18 DIAGNOSIS — J209 Acute bronchitis, unspecified: Secondary | ICD-10-CM | POA: Diagnosis not present

## 2020-09-07 DIAGNOSIS — H52203 Unspecified astigmatism, bilateral: Secondary | ICD-10-CM | POA: Diagnosis not present

## 2020-09-07 DIAGNOSIS — H524 Presbyopia: Secondary | ICD-10-CM | POA: Diagnosis not present

## 2020-09-07 DIAGNOSIS — H35373 Puckering of macula, bilateral: Secondary | ICD-10-CM | POA: Diagnosis not present

## 2020-09-12 DIAGNOSIS — M1811 Unilateral primary osteoarthritis of first carpometacarpal joint, right hand: Secondary | ICD-10-CM | POA: Diagnosis not present

## 2020-10-04 DIAGNOSIS — J00 Acute nasopharyngitis [common cold]: Secondary | ICD-10-CM | POA: Diagnosis not present

## 2020-10-04 DIAGNOSIS — Z20822 Contact with and (suspected) exposure to covid-19: Secondary | ICD-10-CM | POA: Diagnosis not present

## 2020-10-13 DIAGNOSIS — R059 Cough, unspecified: Secondary | ICD-10-CM | POA: Diagnosis not present

## 2020-10-13 DIAGNOSIS — R509 Fever, unspecified: Secondary | ICD-10-CM | POA: Diagnosis not present

## 2020-10-13 DIAGNOSIS — Z1152 Encounter for screening for COVID-19: Secondary | ICD-10-CM | POA: Diagnosis not present

## 2020-10-13 DIAGNOSIS — J029 Acute pharyngitis, unspecified: Secondary | ICD-10-CM | POA: Diagnosis not present

## 2020-10-13 DIAGNOSIS — B349 Viral infection, unspecified: Secondary | ICD-10-CM | POA: Diagnosis not present

## 2020-10-13 DIAGNOSIS — Z20818 Contact with and (suspected) exposure to other bacterial communicable diseases: Secondary | ICD-10-CM | POA: Diagnosis not present

## 2020-10-27 DIAGNOSIS — E039 Hypothyroidism, unspecified: Secondary | ICD-10-CM | POA: Diagnosis not present

## 2020-10-27 DIAGNOSIS — E785 Hyperlipidemia, unspecified: Secondary | ICD-10-CM | POA: Diagnosis not present

## 2020-10-28 DIAGNOSIS — R921 Mammographic calcification found on diagnostic imaging of breast: Secondary | ICD-10-CM | POA: Diagnosis not present

## 2020-10-28 DIAGNOSIS — N6082 Other benign mammary dysplasias of left breast: Secondary | ICD-10-CM | POA: Diagnosis not present

## 2020-11-02 DIAGNOSIS — R82998 Other abnormal findings in urine: Secondary | ICD-10-CM | POA: Diagnosis not present

## 2020-11-02 DIAGNOSIS — M48061 Spinal stenosis, lumbar region without neurogenic claudication: Secondary | ICD-10-CM | POA: Diagnosis not present

## 2020-11-02 DIAGNOSIS — M1990 Unspecified osteoarthritis, unspecified site: Secondary | ICD-10-CM | POA: Diagnosis not present

## 2020-11-02 DIAGNOSIS — Z720 Tobacco use: Secondary | ICD-10-CM | POA: Diagnosis not present

## 2020-11-02 DIAGNOSIS — Z79899 Other long term (current) drug therapy: Secondary | ICD-10-CM | POA: Diagnosis not present

## 2020-11-02 DIAGNOSIS — E039 Hypothyroidism, unspecified: Secondary | ICD-10-CM | POA: Diagnosis not present

## 2020-11-02 DIAGNOSIS — Z0000 Encounter for general adult medical examination without abnormal findings: Secondary | ICD-10-CM | POA: Diagnosis not present

## 2020-11-02 DIAGNOSIS — E785 Hyperlipidemia, unspecified: Secondary | ICD-10-CM | POA: Diagnosis not present

## 2020-11-02 DIAGNOSIS — I872 Venous insufficiency (chronic) (peripheral): Secondary | ICD-10-CM | POA: Diagnosis not present

## 2020-11-23 DIAGNOSIS — Z1212 Encounter for screening for malignant neoplasm of rectum: Secondary | ICD-10-CM | POA: Diagnosis not present

## 2020-12-16 DIAGNOSIS — M1811 Unilateral primary osteoarthritis of first carpometacarpal joint, right hand: Secondary | ICD-10-CM | POA: Diagnosis not present

## 2021-01-19 ENCOUNTER — Other Ambulatory Visit: Payer: Self-pay | Admitting: Internal Medicine

## 2021-01-19 DIAGNOSIS — M48061 Spinal stenosis, lumbar region without neurogenic claudication: Secondary | ICD-10-CM

## 2021-01-31 ENCOUNTER — Ambulatory Visit
Admission: RE | Admit: 2021-01-31 | Discharge: 2021-01-31 | Disposition: A | Discharge status: Home / Self Care | Payer: Medicare PPO | Source: Ambulatory Visit | Attending: Internal Medicine | Admitting: Internal Medicine

## 2021-01-31 ENCOUNTER — Other Ambulatory Visit: Payer: Self-pay

## 2021-01-31 DIAGNOSIS — M48061 Spinal stenosis, lumbar region without neurogenic claudication: Secondary | ICD-10-CM

## 2021-01-31 DIAGNOSIS — M47816 Spondylosis without myelopathy or radiculopathy, lumbar region: Secondary | ICD-10-CM | POA: Diagnosis not present

## 2021-01-31 MED ORDER — METHYLPREDNISOLONE ACETATE 40 MG/ML INJ SUSP (RADIOLOG
120.0000 mg | Freq: Once | INTRAMUSCULAR | Status: AC
  Administered 2021-01-31: 120 mg via EPIDURAL

## 2021-01-31 MED ORDER — IOPAMIDOL (ISOVUE-M 200) INJECTION 41%
1.0000 mL | Freq: Once | INTRAMUSCULAR | Status: AC
  Administered 2021-01-31: 1 mL via EPIDURAL

## 2021-01-31 NOTE — Discharge Instructions (Signed)

## 2021-03-17 DIAGNOSIS — M1811 Unilateral primary osteoarthritis of first carpometacarpal joint, right hand: Secondary | ICD-10-CM | POA: Diagnosis not present

## 2021-05-03 DIAGNOSIS — E785 Hyperlipidemia, unspecified: Secondary | ICD-10-CM | POA: Diagnosis not present

## 2021-05-03 DIAGNOSIS — I872 Venous insufficiency (chronic) (peripheral): Secondary | ICD-10-CM | POA: Diagnosis not present

## 2021-05-03 DIAGNOSIS — M1990 Unspecified osteoarthritis, unspecified site: Secondary | ICD-10-CM | POA: Diagnosis not present

## 2021-05-03 DIAGNOSIS — M48061 Spinal stenosis, lumbar region without neurogenic claudication: Secondary | ICD-10-CM | POA: Diagnosis not present

## 2021-05-03 DIAGNOSIS — E039 Hypothyroidism, unspecified: Secondary | ICD-10-CM | POA: Diagnosis not present

## 2021-05-18 ENCOUNTER — Other Ambulatory Visit: Payer: Self-pay | Admitting: Internal Medicine

## 2021-05-18 DIAGNOSIS — M48061 Spinal stenosis, lumbar region without neurogenic claudication: Secondary | ICD-10-CM

## 2021-06-01 ENCOUNTER — Other Ambulatory Visit: Payer: Self-pay

## 2021-06-01 ENCOUNTER — Ambulatory Visit
Admission: RE | Admit: 2021-06-01 | Discharge: 2021-06-01 | Disposition: A | Discharge status: Home / Self Care | Payer: Medicare PPO | Source: Ambulatory Visit | Attending: Internal Medicine | Admitting: Internal Medicine

## 2021-06-01 DIAGNOSIS — M47816 Spondylosis without myelopathy or radiculopathy, lumbar region: Secondary | ICD-10-CM | POA: Diagnosis not present

## 2021-06-01 DIAGNOSIS — M48061 Spinal stenosis, lumbar region without neurogenic claudication: Secondary | ICD-10-CM

## 2021-06-01 MED ORDER — IOPAMIDOL (ISOVUE-M 200) INJECTION 41%
1.0000 mL | Freq: Once | INTRAMUSCULAR | Status: AC
  Administered 2021-06-01: 1 mL via EPIDURAL

## 2021-06-01 MED ORDER — METHYLPREDNISOLONE ACETATE 40 MG/ML INJ SUSP (RADIOLOG
80.0000 mg | Freq: Once | INTRAMUSCULAR | Status: AC
  Administered 2021-06-01: 80 mg via EPIDURAL

## 2021-06-01 NOTE — Discharge Instructions (Signed)
Post Procedure Spinal Discharge Instruction Sheet  1. You may resume a regular diet and any medications that you routinely take (including pain medications).  2. No driving day of procedure.  3. Light activity throughout the rest of the day.  Do not do any strenuous work, exercise, bending or lifting.  The day following the procedure, you can resume normal physical activity but you should refrain from exercising or physical therapy for at least three days thereafter.   Common Side Effects:   Headaches- take your usual medications as directed by your physician.  Increase your fluid intake.  Caffeinated beverages may be helpful.  Lie flat in bed until your headache resolves.   Restlessness or inability to sleep- you may have trouble sleeping for the next few days.  Ask your referring physician if you need any medication for sleep.   Facial flushing or redness- should subside within a few days.   Increased pain- a temporary increase in pain a day or two following your procedure is not unusual.  Take your pain medication as prescribed by your referring physician.   Leg cramps  Please contact our office at [phone] for the following symptoms:  Fever greater than 100 degrees.  Headaches unresolved with medication after 2-3 days.  Increased swelling, pain, or redness at injection site.   Thank you for visiting [facility] today.  

## 2021-08-13 ENCOUNTER — Other Ambulatory Visit: Payer: Self-pay

## 2021-08-13 ENCOUNTER — Encounter (HOSPITAL_COMMUNITY): Payer: Self-pay | Admitting: Emergency Medicine

## 2021-08-13 ENCOUNTER — Emergency Department (HOSPITAL_COMMUNITY)
Admission: EM | Admit: 2021-08-13 | Discharge: 2021-08-13 | Disposition: A | Discharge status: Home / Self Care | Payer: Medicare PPO | Attending: Emergency Medicine | Admitting: Emergency Medicine

## 2021-08-13 DIAGNOSIS — S61012A Laceration without foreign body of left thumb without damage to nail, initial encounter: Secondary | ICD-10-CM | POA: Diagnosis not present

## 2021-08-13 DIAGNOSIS — S61412A Laceration without foreign body of left hand, initial encounter: Secondary | ICD-10-CM

## 2021-08-13 DIAGNOSIS — E039 Hypothyroidism, unspecified: Secondary | ICD-10-CM | POA: Insufficient documentation

## 2021-08-13 DIAGNOSIS — F17210 Nicotine dependence, cigarettes, uncomplicated: Secondary | ICD-10-CM | POA: Insufficient documentation

## 2021-08-13 DIAGNOSIS — Z79899 Other long term (current) drug therapy: Secondary | ICD-10-CM | POA: Insufficient documentation

## 2021-08-13 DIAGNOSIS — S60922A Unspecified superficial injury of left hand, initial encounter: Secondary | ICD-10-CM | POA: Diagnosis present

## 2021-08-13 DIAGNOSIS — Y9389 Activity, other specified: Secondary | ICD-10-CM | POA: Diagnosis not present

## 2021-08-13 DIAGNOSIS — W260XXA Contact with knife, initial encounter: Secondary | ICD-10-CM | POA: Diagnosis not present

## 2021-08-13 DIAGNOSIS — Y9289 Other specified places as the place of occurrence of the external cause: Secondary | ICD-10-CM | POA: Insufficient documentation

## 2021-08-13 MED ORDER — LIDOCAINE HCL (PF) 1 % IJ SOLN: 30.0000 mL | Freq: Once | INTRAMUSCULAR | Status: AC

## 2021-08-13 MED ORDER — LIDOCAINE HCL (PF) 1 % IJ SOLN
INTRAMUSCULAR | Status: AC
  Administered 2021-08-13: 30 mL
  Filled 2021-08-13: qty 30

## 2021-08-13 NOTE — ED Triage Notes (Signed)
Pt has ~1 inch laceration on left palm from knife. States she was cutting ham and the knife slipped. Bleeding controlled at this time.

## 2021-08-13 NOTE — Discharge Instructions (Addendum)
Return in 8 to 10 days for suture removal.  You may also go to urgent care to get for suture removal.  Turn immediately PC signs of redness infection purulent discharge or additional concerns.  Keep the incision dry for 48 hours, you may wash gently with soap and water afterwards.  Keep the wound covered for at least 5 days.

## 2021-08-13 NOTE — ED Provider Notes (Signed)
[facility] ([place] [place] [facility]ENCY DEPT Provider Note   CSN: [id number] Arrival date & time: 08/13/21  2026     History Chief Complaint  Patient presents with   Laceration    Tracey Johnson is a 71 y.o. female.  Patient presents with laceration to the base of her left hand palmar surface.  Injury occurred just hour prior to arrival.  She states she was cutting steak when the knife slipped and the sharp point went into the base of her left hand near her thumb.  Last tetanus about 10 years ago.  O otherwise denies recent illnesses.  Denies fevers cough vomiting or diarrhea.      Past Medical History:  Diagnosis Date   Arthritis    Hypercholesterolemia    Hypothyroidism     Patient Active Problem List   Diagnosis Date Noted   Chronic thoracic back pain 03/26/2018   Myofascial pain 03/26/2018   Degeneration of lumbar intervertebral disc 03/03/2018   AKI (acute kidney injury) (HCC)    Encounter for central line placement    Septic shock (HCC)    Sepsis (HCC) 09/30/2017    Past Surgical History:  Procedure Laterality Date   TONSILLECTOMY  1971   VAGINAL HYSTERECTOMY  1994     OB History   No obstetric history on file.     Family History  Problem Relation Age of Onset   Colon cancer Neg Hx     Social History   Tobacco Use   Smoking status: Every Day    Packs/day: 1.00    Types: Cigarettes    Start date: 11/25/1971   Smokeless tobacco: Never    Home Medications Prior to Admission medications   Medication Sig Start Date End Date Taking? Authorizing Provider  albuterol (PROVENTIL HFA;VENTOLIN HFA) 108 (90 Base) MCG/ACT inhaler albuterol sulfate HFA 90 mcg/actuation aerosol inhaler  INHALE 2 PUFFS EVERY 4-6 HOURS AS NEEDED FOR COUGHING PAROXYSMS/SHORTNESS OF BREATH    [provider]  diclofenac sodium (VOLTAREN) 1 % GEL Apply 2 g topically 4 (four) times daily. Rub into affected area of foot 2 to 4 times daily 06/16/18   Vivi Barrack,  DPM  HYDROcodone-acetaminophen (NORCO/VICODIN) 5-325 MG tablet Take 1 tablet by mouth every 6 (six) hours as needed. for pain 02/17/18   [provider]  levothyroxine (SYNTHROID, LEVOTHROID) 75 MCG tablet Take 75 mcg by mouth daily before breakfast.    [provider]  naproxen (NAPROSYN) 500 MG tablet Take 500 mg by mouth 2 (two) times daily with a meal.    [provider]  pravastatin (PRAVACHOL) 40 MG tablet Take 40 mg by mouth daily.  09/26/12   [provider]  tamsulosin (FLOMAX) 0.4 MG CAPS capsule tamsulosin 0.4 mg capsule  TAKE 1 CAPSULE BY MOUTH EVERY DAY    [provider]  tiZANidine (ZANAFLEX) 2 MG tablet Take one tablet by mouth twice daily.    [provider]  VIVELLE-DOT 0.075 MG/24HR APPLY 1 PATCH TWICE WEEKLY 04/10/12   Rickard Patience, PA-C    Allergies    Erythromycin, Penicillins, and Sulfa antibiotics  Review of Systems   Review of Systems  Constitutional:  Negative for fever.  HENT:  Negative for ear pain.   Eyes:  Negative for pain.  Respiratory:  Negative for cough.   Cardiovascular:  Negative for chest pain.  Gastrointestinal:  Negative for abdominal pain.  Genitourinary:  Negative for flank pain.  Musculoskeletal:  Negative for back pain.  Skin:  Positive for wound. Negative for rash.  Neurological:  Negative for headaches.   Physical Exam Updated Vital Signs BP 139/84 (BP Location: Right Arm)   Pulse 79   Temp 97.9 F (36.6 C) (Oral)   Resp 16   SpO2 99%   Physical Exam Constitutional:      General: She is not in acute distress.    Appearance: Normal appearance.  HENT:     Head: Normocephalic.     Nose: Nose normal.  Eyes:     Extraocular Movements: Extraocular movements intact.  Cardiovascular:     Rate and Rhythm: Normal rate.  Pulmonary:     Effort: Pulmonary effort is normal.  Musculoskeletal:        General: Normal range of motion.     Cervical back: Normal range of motion.      Comments: Neurovascular intact bilateral upper extremities.  Intact radial ulnar median nerve.  Intact abduction of the thumb bilaterally.  Skin:    Comments: 1.5 cm laceration on the palmar surface of left hand base of the thumb.  Neurological:     General: No focal deficit present.     Mental Status: She is alert. Mental status is at baseline.    ED Results / Procedures / Treatments   Labs (all labs ordered are listed, but only abnormal results are displayed) Labs Reviewed - No data to display  EKG None  Radiology No results found.  Procedures .Marland KitchenLaceration Repair  Date/Time: 08/13/2021 9:23 PM Performed by: Cheryll Cockayne, MD Authorized by: Cheryll Cockayne, MD   Comments:     1.5 cm linear laceration on palmar surface base of the left thumb.  Lidocaine 1% no epinephrine total of 2 cc instilled with good local anesthesia.  Wound thoroughly irrigated with sterile water.  One 4-0 Ethilon suture was placed with good approximation of edges.   Medications Ordered in ED Medications  lidocaine (PF) (XYLOCAINE) 1 % injection 30 mL (30 mLs Infiltration Given by Other 08/13/21 2118)    ED Course  I have reviewed the triage vital signs and the nursing notes.  Pertinent labs & imaging results that were available during my care of the patient were reviewed by me and considered in my medical decision making (see chart for details).    MDM Rules/Calculators/A&P                           Advised return in 8 to 10 days for suture removal.  Advised immediate return for signs infection such as purulent discharge fevers pain or any additional concerns.  Final Clinical Impression(s) / ED Diagnoses Final diagnoses:  Laceration of left hand without foreign body, initial encounter    Rx / DC Orders ED Discharge Orders     None        Cheryll Cockayne, MD 08/13/21 2129

## 2021-08-23 DIAGNOSIS — S61412A Laceration without foreign body of left hand, initial encounter: Secondary | ICD-10-CM | POA: Diagnosis not present

## 2021-08-23 DIAGNOSIS — Z4802 Encounter for removal of sutures: Secondary | ICD-10-CM | POA: Diagnosis not present

## 2021-09-06 DIAGNOSIS — H52203 Unspecified astigmatism, bilateral: Secondary | ICD-10-CM | POA: Diagnosis not present

## 2021-09-06 DIAGNOSIS — H43813 Vitreous degeneration, bilateral: Secondary | ICD-10-CM | POA: Diagnosis not present

## 2021-09-06 DIAGNOSIS — H35373 Puckering of macula, bilateral: Secondary | ICD-10-CM | POA: Diagnosis not present

## 2021-09-06 DIAGNOSIS — H26491 Other secondary cataract, right eye: Secondary | ICD-10-CM | POA: Diagnosis not present

## 2021-10-30 DIAGNOSIS — E039 Hypothyroidism, unspecified: Secondary | ICD-10-CM | POA: Diagnosis not present

## 2021-10-30 DIAGNOSIS — E785 Hyperlipidemia, unspecified: Secondary | ICD-10-CM | POA: Diagnosis not present

## 2021-11-02 DIAGNOSIS — Z1231 Encounter for screening mammogram for malignant neoplasm of breast: Secondary | ICD-10-CM | POA: Diagnosis not present

## 2021-11-06 DIAGNOSIS — E039 Hypothyroidism, unspecified: Secondary | ICD-10-CM | POA: Diagnosis not present

## 2021-11-06 DIAGNOSIS — Z0000 Encounter for general adult medical examination without abnormal findings: Secondary | ICD-10-CM | POA: Diagnosis not present

## 2021-11-06 DIAGNOSIS — M48061 Spinal stenosis, lumbar region without neurogenic claudication: Secondary | ICD-10-CM | POA: Diagnosis not present

## 2021-11-06 DIAGNOSIS — Z1389 Encounter for screening for other disorder: Secondary | ICD-10-CM | POA: Diagnosis not present

## 2021-11-06 DIAGNOSIS — Z720 Tobacco use: Secondary | ICD-10-CM | POA: Diagnosis not present

## 2021-11-06 DIAGNOSIS — E785 Hyperlipidemia, unspecified: Secondary | ICD-10-CM | POA: Diagnosis not present

## 2021-11-06 DIAGNOSIS — Z1331 Encounter for screening for depression: Secondary | ICD-10-CM | POA: Diagnosis not present

## 2021-11-06 DIAGNOSIS — M1990 Unspecified osteoarthritis, unspecified site: Secondary | ICD-10-CM | POA: Diagnosis not present

## 2021-11-28 DIAGNOSIS — M25552 Pain in left hip: Secondary | ICD-10-CM | POA: Diagnosis not present

## 2021-11-28 DIAGNOSIS — M48061 Spinal stenosis, lumbar region without neurogenic claudication: Secondary | ICD-10-CM | POA: Diagnosis not present

## 2021-12-19 ENCOUNTER — Other Ambulatory Visit: Payer: Self-pay | Admitting: Internal Medicine

## 2021-12-19 DIAGNOSIS — M48061 Spinal stenosis, lumbar region without neurogenic claudication: Secondary | ICD-10-CM

## 2022-01-08 ENCOUNTER — Ambulatory Visit
Admission: RE | Admit: 2022-01-08 | Discharge: 2022-01-08 | Disposition: A | Discharge status: Home / Self Care | Payer: Medicare PPO | Source: Ambulatory Visit | Attending: Internal Medicine | Admitting: Internal Medicine

## 2022-01-08 DIAGNOSIS — M48061 Spinal stenosis, lumbar region without neurogenic claudication: Secondary | ICD-10-CM

## 2022-01-08 MED ORDER — METHYLPREDNISOLONE ACETATE 40 MG/ML INJ SUSP (RADIOLOG
80.0000 mg | Freq: Once | INTRAMUSCULAR | Status: AC
  Administered 2022-01-08: 80 mg via EPIDURAL

## 2022-01-08 MED ORDER — IOPAMIDOL (ISOVUE-M 200) INJECTION 41%
1.0000 mL | Freq: Once | INTRAMUSCULAR | Status: AC
  Administered 2022-01-08: 1 mL via EPIDURAL

## 2022-01-08 NOTE — Discharge Instructions (Signed)

## 2022-03-14 DIAGNOSIS — M5136 Other intervertebral disc degeneration, lumbar region: Secondary | ICD-10-CM | POA: Diagnosis not present

## 2022-03-14 DIAGNOSIS — M7072 Other bursitis of hip, left hip: Secondary | ICD-10-CM | POA: Diagnosis not present

## 2022-03-21 DIAGNOSIS — M7072 Other bursitis of hip, left hip: Secondary | ICD-10-CM | POA: Diagnosis not present

## 2022-03-26 DIAGNOSIS — R82998 Other abnormal findings in urine: Secondary | ICD-10-CM | POA: Diagnosis not present

## 2022-03-27 DIAGNOSIS — Z720 Tobacco use: Secondary | ICD-10-CM | POA: Diagnosis not present

## 2022-03-27 DIAGNOSIS — R319 Hematuria, unspecified: Secondary | ICD-10-CM | POA: Diagnosis not present

## 2022-03-27 DIAGNOSIS — S41112A Laceration without foreign body of left upper arm, initial encounter: Secondary | ICD-10-CM | POA: Diagnosis not present

## 2022-03-27 DIAGNOSIS — N390 Urinary tract infection, site not specified: Secondary | ICD-10-CM | POA: Diagnosis not present

## 2022-05-07 DIAGNOSIS — M1990 Unspecified osteoarthritis, unspecified site: Secondary | ICD-10-CM | POA: Diagnosis not present

## 2022-05-07 DIAGNOSIS — Z79899 Other long term (current) drug therapy: Secondary | ICD-10-CM | POA: Diagnosis not present

## 2022-05-07 DIAGNOSIS — M48061 Spinal stenosis, lumbar region without neurogenic claudication: Secondary | ICD-10-CM | POA: Diagnosis not present

## 2022-05-07 DIAGNOSIS — Z720 Tobacco use: Secondary | ICD-10-CM | POA: Diagnosis not present

## 2022-05-23 DIAGNOSIS — M7072 Other bursitis of hip, left hip: Secondary | ICD-10-CM | POA: Diagnosis not present

## 2022-06-13 DIAGNOSIS — M1990 Unspecified osteoarthritis, unspecified site: Secondary | ICD-10-CM | POA: Diagnosis not present

## 2022-06-13 DIAGNOSIS — Z1152 Encounter for screening for COVID-19: Secondary | ICD-10-CM | POA: Diagnosis not present

## 2022-06-13 DIAGNOSIS — Z720 Tobacco use: Secondary | ICD-10-CM | POA: Diagnosis not present

## 2022-06-13 DIAGNOSIS — J4521 Mild intermittent asthma with (acute) exacerbation: Secondary | ICD-10-CM | POA: Diagnosis not present

## 2022-06-13 DIAGNOSIS — R051 Acute cough: Secondary | ICD-10-CM | POA: Diagnosis not present

## 2022-07-12 ENCOUNTER — Other Ambulatory Visit: Payer: Self-pay | Admitting: Internal Medicine

## 2022-07-12 DIAGNOSIS — M48061 Spinal stenosis, lumbar region without neurogenic claudication: Secondary | ICD-10-CM

## 2022-07-24 ENCOUNTER — Ambulatory Visit
Admission: RE | Admit: 2022-07-24 | Discharge: 2022-07-24 | Disposition: A | Discharge status: Home / Self Care | Payer: Medicare PPO | Source: Ambulatory Visit | Attending: Internal Medicine | Admitting: Internal Medicine

## 2022-07-24 DIAGNOSIS — M48061 Spinal stenosis, lumbar region without neurogenic claudication: Secondary | ICD-10-CM

## 2022-07-24 DIAGNOSIS — M47816 Spondylosis without myelopathy or radiculopathy, lumbar region: Secondary | ICD-10-CM | POA: Diagnosis not present

## 2022-07-24 MED ORDER — METHYLPREDNISOLONE ACETATE 40 MG/ML INJ SUSP (RADIOLOG
80.0000 mg | Freq: Once | INTRAMUSCULAR | Status: AC
  Administered 2022-07-24: 80 mg via EPIDURAL

## 2022-07-24 MED ORDER — IOPAMIDOL (ISOVUE-M 200) INJECTION 41%
1.0000 mL | Freq: Once | INTRAMUSCULAR | Status: AC
  Administered 2022-07-24: 1 mL via EPIDURAL

## 2022-07-24 NOTE — Discharge Instructions (Signed)

## 2022-08-01 DIAGNOSIS — H6981 Other specified disorders of Eustachian tube, right ear: Secondary | ICD-10-CM | POA: Diagnosis not present

## 2022-08-01 DIAGNOSIS — E785 Hyperlipidemia, unspecified: Secondary | ICD-10-CM | POA: Diagnosis not present

## 2022-08-01 DIAGNOSIS — Z720 Tobacco use: Secondary | ICD-10-CM | POA: Diagnosis not present

## 2022-08-01 DIAGNOSIS — M1990 Unspecified osteoarthritis, unspecified site: Secondary | ICD-10-CM | POA: Diagnosis not present

## 2022-08-01 DIAGNOSIS — M48061 Spinal stenosis, lumbar region without neurogenic claudication: Secondary | ICD-10-CM | POA: Diagnosis not present

## 2022-08-01 DIAGNOSIS — M25552 Pain in left hip: Secondary | ICD-10-CM | POA: Diagnosis not present

## 2022-08-01 DIAGNOSIS — E039 Hypothyroidism, unspecified: Secondary | ICD-10-CM | POA: Diagnosis not present

## 2022-08-01 DIAGNOSIS — Z79899 Other long term (current) drug therapy: Secondary | ICD-10-CM | POA: Diagnosis not present

## 2022-08-22 ENCOUNTER — Other Ambulatory Visit: Payer: Self-pay | Admitting: Internal Medicine

## 2022-08-22 DIAGNOSIS — M48061 Spinal stenosis, lumbar region without neurogenic claudication: Secondary | ICD-10-CM

## 2022-08-22 DIAGNOSIS — M5416 Radiculopathy, lumbar region: Secondary | ICD-10-CM

## 2022-09-07 IMAGING — XA Imaging study
2 series · 2 of 2 positions shown · non-contrast
Comparison: none

CLINICAL DATA: Spinal stenosis, lumbar region, without neurogenic
claudication. Pain and radicular symptoms left worse than right.

[Series 1: ortho standard · 1 of 1 slices shown (1 of 2)]
[im 1/1]
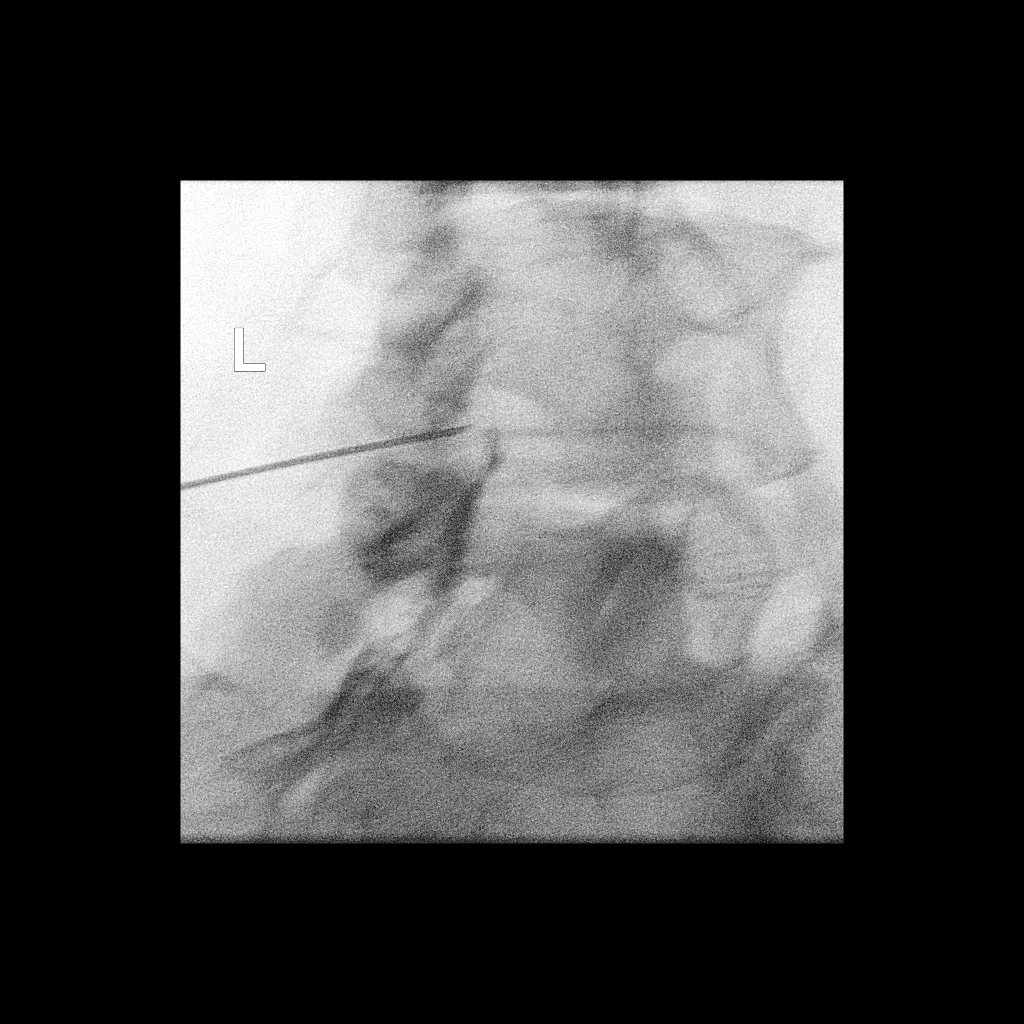

[Series 2: ortho standard · 1 of 1 slices shown (2 of 2)]
[im 1/1]
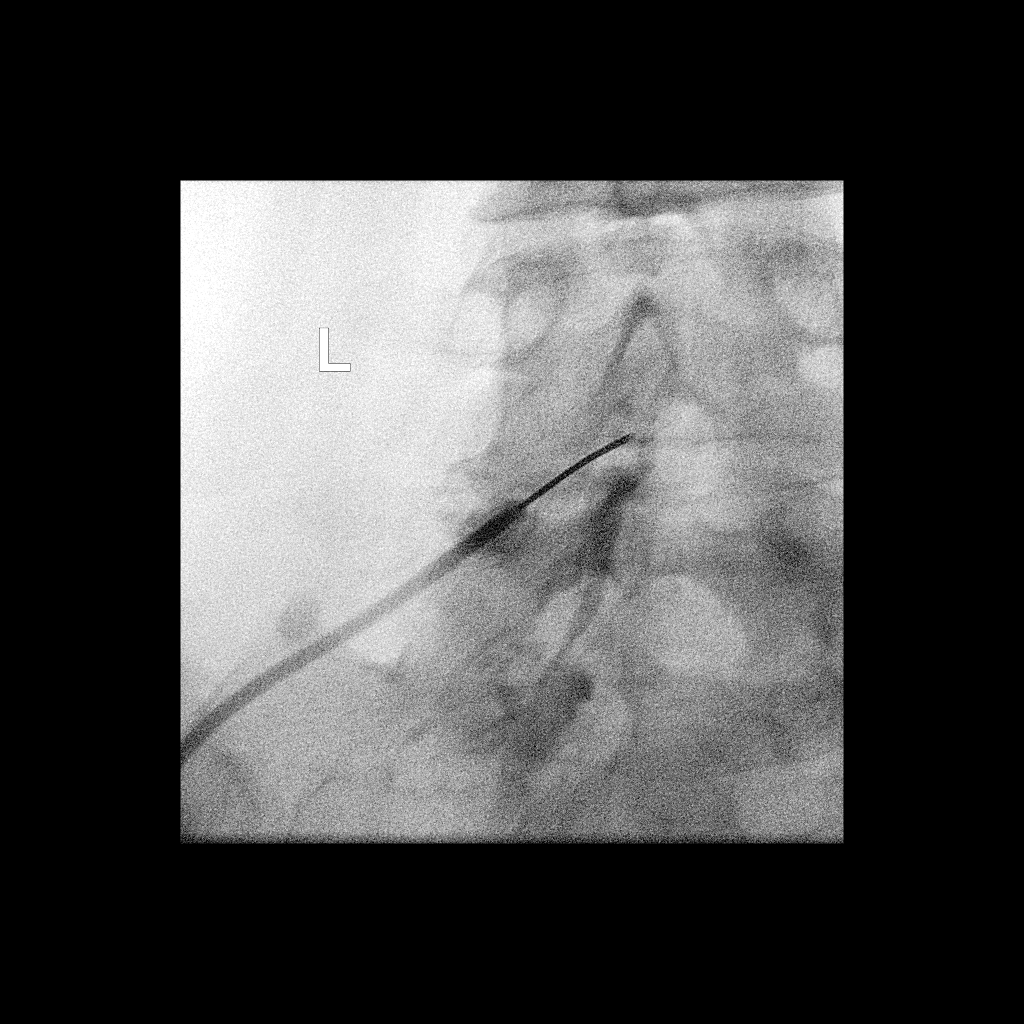

[2 of 2 positions shown; findings below may reference images not displayed]

FLUOROSCOPY TIME:  0 minutes 44 seconds. 34.41 micro gray meter
squared

PROCEDURE:
The procedure, risks, benefits, and alternatives were explained to
the patient. Questions regarding the procedure were encouraged and
answered. The patient understands and consents to the procedure.

LUMBAR EPIDURAL INJECTION:

An interlaminar approach was performed on the left at L4-5. The
overlying skin was cleansed and anesthetized. A 20 gauge epidural
needle was advanced using loss-of-resistance technique.

DIAGNOSTIC EPIDURAL INJECTION:

Injection of Isovue-M 200 shows a good epidural pattern with spread
above and below the level of needle placement, primarily on the
left. No vascular opacification is seen.

THERAPEUTIC EPIDURAL INJECTION:

Eighty mg of Depo-Medrol mixed with 2.5 cc 1% lidocaine were
instilled. The procedure was well-tolerated, and the patient was
discharged thirty minutes following the injection in good condition.

COMPLICATIONS:
None
IMPRESSION: Technically successful epidural injection on the left at L4-5.

## 2022-09-10 ENCOUNTER — Ambulatory Visit
Admission: RE | Admit: 2022-09-10 | Discharge: 2022-09-10 | Disposition: A | Discharge status: Home / Self Care | Payer: Medicare PPO | Source: Ambulatory Visit | Attending: Internal Medicine | Admitting: Internal Medicine

## 2022-09-10 DIAGNOSIS — M5416 Radiculopathy, lumbar region: Secondary | ICD-10-CM

## 2022-09-10 DIAGNOSIS — M48061 Spinal stenosis, lumbar region without neurogenic claudication: Secondary | ICD-10-CM

## 2022-09-10 DIAGNOSIS — M545 Low back pain, unspecified: Secondary | ICD-10-CM | POA: Diagnosis not present

## 2022-09-11 DIAGNOSIS — H26491 Other secondary cataract, right eye: Secondary | ICD-10-CM | POA: Diagnosis not present

## 2022-09-11 DIAGNOSIS — H35373 Puckering of macula, bilateral: Secondary | ICD-10-CM | POA: Diagnosis not present

## 2022-09-11 DIAGNOSIS — H52203 Unspecified astigmatism, bilateral: Secondary | ICD-10-CM | POA: Diagnosis not present

## 2022-09-11 DIAGNOSIS — H43813 Vitreous degeneration, bilateral: Secondary | ICD-10-CM | POA: Diagnosis not present

## 2022-09-11 DIAGNOSIS — H04123 Dry eye syndrome of bilateral lacrimal glands: Secondary | ICD-10-CM | POA: Diagnosis not present

## 2022-09-11 DIAGNOSIS — H524 Presbyopia: Secondary | ICD-10-CM | POA: Diagnosis not present

## 2022-11-06 DIAGNOSIS — E785 Hyperlipidemia, unspecified: Secondary | ICD-10-CM | POA: Diagnosis not present

## 2022-11-06 DIAGNOSIS — E039 Hypothyroidism, unspecified: Secondary | ICD-10-CM | POA: Diagnosis not present

## 2022-11-06 DIAGNOSIS — R7989 Other specified abnormal findings of blood chemistry: Secondary | ICD-10-CM | POA: Diagnosis not present

## 2022-11-08 DIAGNOSIS — Z1231 Encounter for screening mammogram for malignant neoplasm of breast: Secondary | ICD-10-CM | POA: Diagnosis not present

## 2022-11-14 DIAGNOSIS — Z0000 Encounter for general adult medical examination without abnormal findings: Secondary | ICD-10-CM | POA: Diagnosis not present

## 2022-11-14 DIAGNOSIS — Z1339 Encounter for screening examination for other mental health and behavioral disorders: Secondary | ICD-10-CM | POA: Diagnosis not present

## 2022-11-14 DIAGNOSIS — Z720 Tobacco use: Secondary | ICD-10-CM | POA: Diagnosis not present

## 2022-11-14 DIAGNOSIS — Z1331 Encounter for screening for depression: Secondary | ICD-10-CM | POA: Diagnosis not present

## 2022-11-14 DIAGNOSIS — M48061 Spinal stenosis, lumbar region without neurogenic claudication: Secondary | ICD-10-CM | POA: Diagnosis not present

## 2022-11-14 DIAGNOSIS — I872 Venous insufficiency (chronic) (peripheral): Secondary | ICD-10-CM | POA: Diagnosis not present

## 2022-11-14 DIAGNOSIS — E039 Hypothyroidism, unspecified: Secondary | ICD-10-CM | POA: Diagnosis not present

## 2022-11-14 DIAGNOSIS — E785 Hyperlipidemia, unspecified: Secondary | ICD-10-CM | POA: Diagnosis not present

## 2022-12-08 ENCOUNTER — Emergency Department (HOSPITAL_COMMUNITY)
Admission: EM | Admit: 2022-12-08 | Discharge: 2022-12-08 | Disposition: A | Discharge status: Home / Self Care | Payer: Medicare PPO | Attending: Emergency Medicine | Admitting: Emergency Medicine

## 2022-12-08 ENCOUNTER — Other Ambulatory Visit: Payer: Self-pay

## 2022-12-08 ENCOUNTER — Encounter (HOSPITAL_COMMUNITY): Payer: Self-pay

## 2022-12-08 ENCOUNTER — Emergency Department (HOSPITAL_COMMUNITY): Payer: Medicare PPO

## 2022-12-08 DIAGNOSIS — K529 Noninfective gastroenteritis and colitis, unspecified: Secondary | ICD-10-CM | POA: Diagnosis not present

## 2022-12-08 DIAGNOSIS — D72829 Elevated white blood cell count, unspecified: Secondary | ICD-10-CM | POA: Insufficient documentation

## 2022-12-08 DIAGNOSIS — K760 Fatty (change of) liver, not elsewhere classified: Secondary | ICD-10-CM | POA: Diagnosis not present

## 2022-12-08 DIAGNOSIS — K625 Hemorrhage of anus and rectum: Secondary | ICD-10-CM | POA: Diagnosis not present

## 2022-12-08 DIAGNOSIS — N200 Calculus of kidney: Secondary | ICD-10-CM | POA: Diagnosis not present

## 2022-12-08 LAB — CBC WITH DIFFERENTIAL/PLATELET
Abs Immature Granulocytes: 0.05 10*3/uL (ref 0.00–0.07)
Abs Immature Granulocytes: 0.03 10*3/uL (ref 0.00–0.07)
Basophils Absolute: 0.1 10*3/uL (ref 0.0–0.1)
Basophils Absolute: 0.1 10*3/uL (ref 0.0–0.1)
Basophils Relative: 1 %
Basophils Relative: 1 %
Eosinophils Absolute: 0.2 10*3/uL (ref 0.0–0.5)
Eosinophils Absolute: 0.3 10*3/uL (ref 0.0–0.5)
Eosinophils Relative: 2 %
Eosinophils Relative: 3 %
HCT: 49.2 % — ABNORMAL HIGH (ref 36.0–46.0)
HCT: 49.9 % — ABNORMAL HIGH (ref 36.0–46.0)
Hemoglobin: 15.6 g/dL — ABNORMAL HIGH (ref 12.0–15.0)
Hemoglobin: 15.9 g/dL — ABNORMAL HIGH (ref 12.0–15.0)
Immature Granulocytes: 0 %
Immature Granulocytes: 0 %
Lymphocytes Relative: 22 %
Lymphocytes Relative: 25 %
Lymphs Abs: 2.6 10*3/uL (ref 0.7–4.0)
Lymphs Abs: 3.2 10*3/uL (ref 0.7–4.0)
MCH: 29.8 pg (ref 26.0–34.0)
MCH: 29.7 pg (ref 26.0–34.0)
MCHC: 31.7 g/dL (ref 30.0–36.0)
MCHC: 31.9 g/dL (ref 30.0–36.0)
MCV: 93.9 fL (ref 80.0–100.0)
MCV: 93.1 fL (ref 80.0–100.0)
Monocytes Absolute: 0.7 10*3/uL (ref 0.1–1.0)
Monocytes Absolute: 0.9 10*3/uL (ref 0.1–1.0)
Monocytes Relative: 6 %
Monocytes Relative: 7 %
Neutro Abs: 8.3 10*3/uL — ABNORMAL HIGH (ref 1.7–7.7)
Neutro Abs: 8 10*3/uL — ABNORMAL HIGH (ref 1.7–7.7)
Neutrophils Relative %: 69 %
Neutrophils Relative %: 64 %
Platelets: 279 10*3/uL (ref 150–400)
Platelets: 289 10*3/uL (ref 150–400)
RBC: 5.24 MIL/uL — ABNORMAL HIGH (ref 3.87–5.11)
RBC: 5.36 MIL/uL — ABNORMAL HIGH (ref 3.87–5.11)
RDW: 12.4 % (ref 11.5–15.5)
RDW: 12.4 % (ref 11.5–15.5)
WBC: 12 10*3/uL — ABNORMAL HIGH (ref 4.0–10.5)
WBC: 12.6 10*3/uL — ABNORMAL HIGH (ref 4.0–10.5)
nRBC: 0 % (ref 0.0–0.2)
nRBC: 0 % (ref 0.0–0.2)

## 2022-12-08 LAB — COMPREHENSIVE METABOLIC PANEL
ALT: 27 U/L (ref 0–44)
AST: 24 U/L (ref 15–41)
Albumin: 3.9 g/dL (ref 3.5–5.0)
Alkaline Phosphatase: 55 U/L (ref 38–126)
Anion gap: 10 (ref 5–15)
BUN: 12 mg/dL (ref 8–23)
CO2: 25 mmol/L (ref 22–32)
Calcium: 9.1 mg/dL (ref 8.9–10.3)
Chloride: 104 mmol/L (ref 98–111)
Creatinine, Ser: 1.07 mg/dL — ABNORMAL HIGH (ref 0.44–1.00)
GFR, Estimated: 55 mL/min — ABNORMAL LOW (ref >60)
Glucose, Bld: 103 mg/dL — ABNORMAL HIGH (ref 70–99)
Potassium: 4.3 mmol/L (ref 3.5–5.1)
Sodium: 139 mmol/L (ref 135–145)
Total Bilirubin: 0.3 mg/dL (ref 0.3–1.2)
Total Protein: 7 g/dL (ref 6.5–8.1)

## 2022-12-08 LAB — URINALYSIS, ROUTINE W REFLEX MICROSCOPIC
Bilirubin Urine: NEGATIVE
Glucose, UA: NEGATIVE mg/dL
Hgb urine dipstick: NEGATIVE
Ketones, ur: 5 mg/dL — AB
Leukocytes,Ua: NEGATIVE
Nitrite: NEGATIVE
Protein, ur: NEGATIVE mg/dL
Specific Gravity, Urine: 1.006 (ref 1.005–1.030)
pH: 6 (ref 5.0–8.0)

## 2022-12-08 LAB — TYPE AND SCREEN
ABO/RH(D): B POS
Antibody Screen: NEGATIVE

## 2022-12-08 LAB — LIPASE, BLOOD: Lipase: 38 U/L (ref 11–51)

## 2022-12-08 LAB — LACTIC ACID, PLASMA: Lactic Acid, Venous: 1.1 mmol/L (ref 0.5–1.9)

## 2022-12-08 MED ORDER — CIPROFLOXACIN HCL 500 MG PO TABS: 500.0000 mg | ORAL_TABLET | Freq: Two times a day (BID) | ORAL | 0 refills | Status: DC

## 2022-12-08 MED ORDER — CIPROFLOXACIN HCL 500 MG PO TABS
500.0000 mg | ORAL_TABLET | Freq: Once | ORAL | Status: AC
  Administered 2022-12-08: 500 mg via ORAL
  Filled 2022-12-08: qty 1

## 2022-12-08 MED ORDER — METRONIDAZOLE 500 MG PO TABS
500.0000 mg | ORAL_TABLET | Freq: Once | ORAL | Status: AC
  Administered 2022-12-08: 500 mg via ORAL
  Filled 2022-12-08: qty 1

## 2022-12-08 MED ORDER — METRONIDAZOLE 500 MG PO TABS: 500.0000 mg | ORAL_TABLET | Freq: Two times a day (BID) | ORAL | 0 refills | Status: DC

## 2022-12-08 NOTE — Discharge Instructions (Signed)
The workup in the emergency room reveals that you have colitis.  This is the reason you are having bloody stool and cramping abdominal pain.  Start the antibiotics that are prescribed.  Follow-up with your PCP in 1 week.  Return to the ER if you start having worsening pain, worsening bleeding, high fevers, inability to keep any medicine or fluids down.

## 2022-12-08 NOTE — ED Provider Notes (Signed)
[facility]-EMERGENCY DEPT Provider Note   CSN: [id number] Arrival date & time: 12/08/22  1124     History {Add pertinent medical, surgical, social history, OB history to HPI:1} Chief Complaint  Patient presents with  . Rectal Bleeding  . Abdominal Pain    Tracey Johnson is a 72 y.o. female.  HPI    72 year old female comes in with chief complaint of rectal bleeding and abdominal pain.  Home Medications Prior to Admission medications   Medication Sig Start Date End Date Taking? Authorizing Provider  ciprofloxacin (CIPRO) 500 MG tablet Take 1 tablet (500 mg total) by mouth every 12 (twelve) hours. 12/08/22  Yes Derwood Kaplan, MD  metroNIDAZOLE (FLAGYL) 500 MG tablet Take 1 tablet (500 mg total) by mouth 2 (two) times daily. 12/08/22  Yes Arlet Marter, MD  albuterol (PROVENTIL HFA;VENTOLIN HFA) 108 (90 Base) MCG/ACT inhaler albuterol sulfate HFA 90 mcg/actuation aerosol inhaler  INHALE 2 PUFFS EVERY 4-6 HOURS AS NEEDED FOR COUGHING PAROXYSMS/SHORTNESS OF BREATH    [provider]  diclofenac sodium (VOLTAREN) 1 % GEL Apply 2 g topically 4 (four) times daily. Rub into affected area of foot 2 to 4 times daily 06/16/18   Vivi Barrack, DPM  HYDROcodone-acetaminophen (NORCO/VICODIN) 5-325 MG tablet Take 1 tablet by mouth every 6 (six) hours as needed. for pain 02/17/18   [provider]  levothyroxine (SYNTHROID, LEVOTHROID) 75 MCG tablet Take 75 mcg by mouth daily before breakfast.    [provider]  naproxen (NAPROSYN) 500 MG tablet Take 500 mg by mouth 2 (two) times daily with a meal.    [provider]  pravastatin (PRAVACHOL) 40 MG tablet Take 40 mg by mouth daily.  09/26/12   [provider]  tamsulosin (FLOMAX) 0.4 MG CAPS capsule tamsulosin 0.4 mg capsule  TAKE 1 CAPSULE BY MOUTH EVERY DAY    [provider]  tiZANidine (ZANAFLEX) 2 MG tablet Take one tablet by mouth twice daily.    [provider]  VIVELLE-DOT 0.075 MG/24HR APPLY 1 PATCH TWICE WEEKLY 04/10/12   Rickard Patience, PA-C      Allergies    Erythromycin, Penicillins, and Sulfa antibiotics    Review of Systems   Review of Systems  Physical Exam Updated Vital Signs BP (!) 140/70 (BP Location: Left Arm)   Pulse 94   Temp 98.4 F (36.9 C) (Oral)   Resp 17   SpO2 96%  Physical Exam  ED Results / Procedures / Treatments   Labs (all labs ordered are listed, but only abnormal results are displayed) Labs Reviewed  CBC WITH DIFFERENTIAL/PLATELET - Abnormal; Notable for the following components:      Result Value   WBC 12.0 (*)    RBC 5.24 (*)    Hemoglobin 15.6 (*)    HCT 49.2 (*)    Neutro Abs 8.3 (*)    All other components within normal limits  COMPREHENSIVE METABOLIC PANEL - Abnormal; Notable for the following components:   Glucose, Bld 103 (*)    Creatinine, Ser 1.07 (*)    GFR, Estimated 55 (*)    All other components within normal limits  URINALYSIS, ROUTINE W REFLEX MICROSCOPIC - Abnormal; Notable for the following components:   Ketones, ur 5 (*)    All other components within normal limits  CBC WITH DIFFERENTIAL/PLATELET - Abnormal; Notable for the following components:   WBC 12.6 (*)    RBC 5.36 (*)    Hemoglobin 15.9 (*)  HCT 49.9 (*)    Neutro Abs 8.0 (*)    All other components within normal limits  LIPASE, BLOOD  LACTIC ACID, PLASMA  TYPE AND SCREEN    EKG None  Radiology CT ABDOMEN PELVIS WO CONTRAST  Result Date: 12/08/2022 CLINICAL DATA:  Acute nonlocalized abdominal pain. Low back pain. Patient reports bright red blood in stool. EXAM: CT ABDOMEN AND PELVIS WITHOUT CONTRAST TECHNIQUE: Multidetector CT imaging of the abdomen and pelvis was performed following the standard protocol without IV contrast. RADIATION DOSE REDUCTION: This exam was performed according to the departmental dose-optimization program which includes automated exposure control, adjustment of the mA  and/or kV according to patient size and/or use of iterative reconstruction technique. COMPARISON:  CT 08/02/2017 FINDINGS: Lower chest: No basilar airspace disease or pleural effusion. Hepatobiliary: No focal liver abnormality on this unenhanced exam. Mild hepatic steatosis. Partially distended gallbladder, no calcified gallstones. No pericholecystic inflammation. No biliary dilatation. Pancreas: No ductal dilatation or inflammation. Spleen: Normal in size without focal abnormality. Adrenals/Urinary Tract: Normal adrenal glands. No hydronephrosis. Least 4 punctate nonobstructing stones in the left kidney. No definite right urolithiasis. There is slight bilateral perinephric edema. No evidence of focal renal lesion. The ureters are decompressed, no ureteral stone. Nondistended urinary bladder, no bladder stone. Stomach/Bowel: Mild colonic wall thickening from the splenic flexure through the mid descending colon with adjacent pericolonic edema suggestive of colitis. Mild distal colonic diverticulosis without focal diverticulitis. No bowel pneumatosis, perforation or abscess. Normal appendix. No small bowel obstruction or inflammation. Small hiatal hernia. Vascular/Lymphatic: Aortic atherosclerosis without aneurysm. Bi-iliac atherosclerosis. Left retroperitoneal phleboliths. No enlarged lymph nodes in the abdomen or pelvis. Reproductive: Hysterectomy. The ovaries are tentatively visualized and quiescent. Other: No ascites. No free air. Small fat containing umbilical hernia. Musculoskeletal: Multilevel degenerative change in the lumbar spine. There are no acute or suspicious osseous abnormalities. IMPRESSION: 1. Colitis from the splenic flexure through the mid descending colon, likely infectious or inflammatory. Distribution of colitis can be seen in the setting of ischemic colitis. 2. Mild distal colonic diverticulosis without focal diverticulitis. 3. Nonobstructing left nephrolithiasis. 4. Mild hepatic steatosis.  Aortic Atherosclerosis (ICD10-I70.0). Electronically Signed   By: Narda Rutherford M.D.   On: 12/08/2022 19:54    Procedures Procedures  {Document cardiac monitor, telemetry assessment procedure when appropriate:1}  Medications Ordered in ED Medications  ciprofloxacin (CIPRO) tablet 500 mg (500 mg Oral Given 12/08/22 2149)  metroNIDAZOLE (FLAGYL) tablet 500 mg (500 mg Oral Given 12/08/22 2149)    ED Course/ Medical Decision Making/ A&P                           Medical Decision Making Risk Prescription drug management.   ***  {Document critical care time when appropriate:1} {Document review of labs and clinical decision tools ie heart score, Chads2Vasc2 etc:1}  {Document your independent review of radiology images, and any outside records:1} {Document your discussion with family members, caretakers, and with consultants:1} {Document social determinants of health affecting pt's care:1} {Document your decision making why or why not admission, treatments were needed:1} Final Clinical Impression(s) / ED Diagnoses Final diagnoses:  Rectal bleeding  Colitis    Rx / DC Orders ED Discharge Orders          Ordered    ciprofloxacin (CIPRO) 500 MG tablet  Every 12 hours        12/08/22 2143    metroNIDAZOLE (FLAGYL) 500 MG tablet  2 times daily  12/08/22 2143            

## 2022-12-08 NOTE — ED Triage Notes (Signed)
Pt presents with c/o some rectal bleeding and abdominal pain. Pt reports she had been constipated for several days earlier this week, took some laxatives, and on Thursday, had a small bowel movement as well as some rectal bleeding. Pt also reports hx of internal hemorrhoids. Pt reports the bleeding is very minimal at this time.

## 2022-12-08 NOTE — ED Provider Triage Note (Cosign Needed Addendum)
Emergency Medicine Provider Triage Evaluation Note  Tracey Johnson , a 72 y.o. female  was evaluated in triage.  Pt complains of lower abdominal pain and cramping that is been ongoing for the past 2 days.  Did report some blood in her stool after taking laxatives, blood only occurs when she defecates and it is present on the tissue.  She describes this as bright red blood.  Does have a history of internal hemorrhoids, reports she thought it was this, however bleeding has not stopped in 2-1/2 days.Not on any blood thinners.   Review of Systems  Positive: Rectal bleeding, abdominal pain Negative: Fever, nausea, vomiting  Physical Exam  BP 134/82 (BP Location: Left Arm)   Pulse 93   Temp 98.4 F (36.9 C) (Oral)   Resp 20   SpO2 97%  Gen:   Awake, no distress   Resp:  Normal effort  MSK:   Moves extremities without difficulty  Other:  Tenderness along the lower abdomen.  Bowel sounds are slightly diminished.  Medical Decision Making  Medically screening exam initiated at 12:08 PM.  Appropriate orders placed.  Rance Muir was informed that the remainder of the evaluation will be completed by another provider, this initial triage assessment does not replace that evaluation, and the importance of remaining in the ED until their evaluation is complete.     Claude Manges, PA-C 12/08/22 1211    Claude Manges, PA-C 12/08/22 1212

## 2022-12-12 DIAGNOSIS — Z720 Tobacco use: Secondary | ICD-10-CM | POA: Diagnosis not present

## 2022-12-12 DIAGNOSIS — Z79899 Other long term (current) drug therapy: Secondary | ICD-10-CM | POA: Diagnosis not present

## 2022-12-12 DIAGNOSIS — M48061 Spinal stenosis, lumbar region without neurogenic claudication: Secondary | ICD-10-CM | POA: Diagnosis not present

## 2022-12-12 DIAGNOSIS — K529 Noninfective gastroenteritis and colitis, unspecified: Secondary | ICD-10-CM | POA: Diagnosis not present

## 2023-01-15 DIAGNOSIS — Z720 Tobacco use: Secondary | ICD-10-CM | POA: Diagnosis not present

## 2023-01-15 DIAGNOSIS — J101 Influenza due to other identified influenza virus with other respiratory manifestations: Secondary | ICD-10-CM | POA: Diagnosis not present

## 2023-01-15 DIAGNOSIS — Z1152 Encounter for screening for COVID-19: Secondary | ICD-10-CM | POA: Diagnosis not present

## 2023-01-15 DIAGNOSIS — R0981 Nasal congestion: Secondary | ICD-10-CM | POA: Diagnosis not present

## 2023-01-15 DIAGNOSIS — J029 Acute pharyngitis, unspecified: Secondary | ICD-10-CM | POA: Diagnosis not present

## 2023-01-15 DIAGNOSIS — R051 Acute cough: Secondary | ICD-10-CM | POA: Diagnosis not present

## 2023-01-15 DIAGNOSIS — R590 Localized enlarged lymph nodes: Secondary | ICD-10-CM | POA: Diagnosis not present

## 2023-01-15 DIAGNOSIS — R638 Other symptoms and signs concerning food and fluid intake: Secondary | ICD-10-CM | POA: Diagnosis not present

## 2023-02-04 ENCOUNTER — Ambulatory Visit: Payer: Medicare PPO | Admitting: Podiatry

## 2023-03-08 DIAGNOSIS — M19041 Primary osteoarthritis, right hand: Secondary | ICD-10-CM | POA: Diagnosis not present

## 2023-03-08 DIAGNOSIS — M19042 Primary osteoarthritis, left hand: Secondary | ICD-10-CM | POA: Diagnosis not present

## 2023-03-08 DIAGNOSIS — M180 Bilateral primary osteoarthritis of first carpometacarpal joints: Secondary | ICD-10-CM | POA: Diagnosis not present

## 2023-04-25 ENCOUNTER — Other Ambulatory Visit: Payer: Self-pay | Admitting: Internal Medicine

## 2023-04-25 DIAGNOSIS — M48061 Spinal stenosis, lumbar region without neurogenic claudication: Secondary | ICD-10-CM

## 2023-05-01 ENCOUNTER — Encounter: Payer: Self-pay | Admitting: Internal Medicine

## 2023-05-20 DIAGNOSIS — M48061 Spinal stenosis, lumbar region without neurogenic claudication: Secondary | ICD-10-CM | POA: Diagnosis not present

## 2023-05-20 DIAGNOSIS — M25552 Pain in left hip: Secondary | ICD-10-CM | POA: Diagnosis not present

## 2023-05-20 DIAGNOSIS — Z720 Tobacco use: Secondary | ICD-10-CM | POA: Diagnosis not present

## 2023-05-23 ENCOUNTER — Ambulatory Visit: Payer: Medicare PPO | Admitting: Podiatry

## 2023-05-23 DIAGNOSIS — M205X2 Other deformities of toe(s) (acquired), left foot: Secondary | ICD-10-CM | POA: Diagnosis not present

## 2023-05-23 DIAGNOSIS — D2372 Other benign neoplasm of skin of left lower limb, including hip: Secondary | ICD-10-CM | POA: Diagnosis not present

## 2023-05-23 NOTE — Progress Notes (Signed)
Subjective:   Patient ID: Tracey Johnson, female   DOB: 73 y.o.   MRN: [id number]   HPI Chief Complaint  Patient presents with   Plantar Warts    Left foot - has tried over the counter pads. It hurts between the toes. She has tried the toe spacers but it hurts   73 year old female presents the office with above concerns.  She states that she has had a skin lesion, possible wart on the left foot and she has tried over-the-counter pad to her to the toes.  She states that she has a spacer also causes discomfort.  She gets pedicures.  No opening or drainage.  Review of Systems  All other systems reviewed and are negative.  Past Medical History:  Diagnosis Date   Arthritis    Hypercholesterolemia    Hypothyroidism     Past Surgical History:  Procedure Laterality Date   TONSILLECTOMY  1971   VAGINAL HYSTERECTOMY  1994     Current Outpatient Medications:    albuterol (PROVENTIL HFA;VENTOLIN HFA) 108 (90 Base) MCG/ACT inhaler, albuterol sulfate HFA 90 mcg/actuation aerosol inhaler  INHALE 2 PUFFS EVERY 4-6 HOURS AS NEEDED FOR COUGHING PAROXYSMS/SHORTNESS OF BREATH, Disp: , Rfl:    ciprofloxacin (CIPRO) 500 MG tablet, Take 1 tablet (500 mg total) by mouth every 12 (twelve) hours., Disp: 10 tablet, Rfl: 0   diclofenac sodium (VOLTAREN) 1 % GEL, Apply 2 g topically 4 (four) times daily. Rub into affected area of foot 2 to 4 times daily, Disp: 100 g, Rfl: 2   HYDROcodone-acetaminophen (NORCO/VICODIN) 5-325 MG tablet, Take 1 tablet by mouth every 6 (six) hours as needed. for pain, Disp: , Rfl: 0   levothyroxine (SYNTHROID, LEVOTHROID) 75 MCG tablet, Take 75 mcg by mouth daily before breakfast., Disp: , Rfl:    metroNIDAZOLE (FLAGYL) 500 MG tablet, Take 1 tablet (500 mg total) by mouth 2 (two) times daily., Disp: 14 tablet, Rfl: 0   naproxen (NAPROSYN) 500 MG tablet, Take 500 mg by mouth 2 (two) times daily with a meal., Disp: , Rfl:    pravastatin (PRAVACHOL) 40 MG tablet, Take 40 mg by mouth  daily. , Disp: , Rfl:    tamsulosin (FLOMAX) 0.4 MG CAPS capsule, tamsulosin 0.4 mg capsule  TAKE 1 CAPSULE BY MOUTH EVERY DAY, Disp: , Rfl:    tiZANidine (ZANAFLEX) 2 MG tablet, Take one tablet by mouth twice daily., Disp: , Rfl:    VIVELLE-DOT 0.075 MG/24HR, APPLY 1 PATCH TWICE WEEKLY, Disp: 8 each, Rfl: 0  Allergies  Allergen Reactions   Erythromycin Rash    "most antibiotics"   Penicillins Rash    "Most antibiotics" Has patient had a PCN reaction causing immediate rash, facial/tongue/throat swelling, SOB or lightheadedness with hypotension: Yes Has patient had a PCN reaction causing severe rash involving mucus membranes or skin necrosis: yes Has patient had a PCN reaction that required hospitalization:No Has patient had a PCN reaction occurring within the last 10 years:No If all of the above answers are "NO", then may proceed with Cephalosporin use.    Sulfa Antibiotics Rash    "most antibiotics"           Objective:  Physical Exam  General: AAO x3, NAD  Dermatological: On the medial aspect left fifth toes thick hyperkeratotic lesion on the IPJ.  Upon debridement there is no underlying ulceration drainage or signs of infection.  No pinpoint bleeding.  Vascular: Dorsalis Pedis artery and Posterior Tibial artery pedal pulses are 2/4 bilateral  with immedate capillary fill time.  There is no pain with calf compression, swelling, warmth, erythema.   Neruologic: Grossly intact via light touch bilateral.   Musculoskeletal: Tenderness to hyperkeratotic lesion.  Active reactions present fifth toe there is prominence along the lateral aspect of the fourth PIPJ.  Gait: Unassisted, Nonantalgic.       Assessment:   Skin lesion left foot     Plan:  -Treatment options discussed including all alternatives, risks, and complications -Etiology of symptoms were discussed -Sharply debrided lesion without any complications or bleeding.  Cleaned skin with alcohol.  Pad was placed about  salicylic acid and a bandage.  Postprocedure instructions discussed.  Tolerated the procedure well. -Discussed shoe modifications, softer toebox as well as offloading. -Briefly discussed surgical intervention in the future if needed.  X-ray next appointment if symptoms persist  Vivi Barrack DPM

## 2023-05-23 NOTE — Patient Instructions (Signed)
Keep the bandage on for 24 hours. At that time, remove and clean with soap and water. If it hurts or burns before 24 hours go ahead and remove the bandage and wash with soap and water. Keep the area clean. If there is any blistering cover with antibiotic ointment and a bandage. Monitor for any redness, drainage, or other signs of infection. Call the office if any are to occur. If you have any questions, please call the office at [phone].  

## 2023-05-24 ENCOUNTER — Ambulatory Visit
Admission: RE | Admit: 2023-05-24 | Discharge: 2023-05-24 | Disposition: A | Discharge status: Home / Self Care | Payer: Medicare PPO | Source: Ambulatory Visit | Attending: Internal Medicine | Admitting: Internal Medicine

## 2023-05-24 ENCOUNTER — Other Ambulatory Visit: Payer: Medicare PPO

## 2023-05-24 DIAGNOSIS — M48061 Spinal stenosis, lumbar region without neurogenic claudication: Secondary | ICD-10-CM

## 2023-05-24 MED ORDER — IOPAMIDOL (ISOVUE-M 200) INJECTION 41%
1.0000 mL | Freq: Once | INTRAMUSCULAR | Status: AC
  Administered 2023-05-24: 1 mL via EPIDURAL

## 2023-05-24 MED ORDER — METHYLPREDNISOLONE ACETATE 40 MG/ML INJ SUSP (RADIOLOG
80.0000 mg | Freq: Once | INTRAMUSCULAR | Status: AC
  Administered 2023-05-24: 80 mg via EPIDURAL

## 2023-05-24 NOTE — Discharge Instructions (Signed)

## 2023-09-16 DIAGNOSIS — H26491 Other secondary cataract, right eye: Secondary | ICD-10-CM | POA: Diagnosis not present

## 2023-09-16 DIAGNOSIS — H35373 Puckering of macula, bilateral: Secondary | ICD-10-CM | POA: Diagnosis not present

## 2023-09-16 DIAGNOSIS — H524 Presbyopia: Secondary | ICD-10-CM | POA: Diagnosis not present

## 2023-09-16 DIAGNOSIS — H43813 Vitreous degeneration, bilateral: Secondary | ICD-10-CM | POA: Diagnosis not present

## 2023-09-16 DIAGNOSIS — H52203 Unspecified astigmatism, bilateral: Secondary | ICD-10-CM | POA: Diagnosis not present

## 2023-09-24 DIAGNOSIS — R638 Other symptoms and signs concerning food and fluid intake: Secondary | ICD-10-CM | POA: Diagnosis not present

## 2023-09-24 DIAGNOSIS — Z720 Tobacco use: Secondary | ICD-10-CM | POA: Diagnosis not present

## 2023-09-24 DIAGNOSIS — B349 Viral infection, unspecified: Secondary | ICD-10-CM | POA: Diagnosis not present

## 2023-09-24 DIAGNOSIS — R52 Pain, unspecified: Secondary | ICD-10-CM | POA: Diagnosis not present

## 2023-09-24 DIAGNOSIS — Z1152 Encounter for screening for COVID-19: Secondary | ICD-10-CM | POA: Diagnosis not present

## 2023-09-24 DIAGNOSIS — R051 Acute cough: Secondary | ICD-10-CM | POA: Diagnosis not present

## 2023-09-24 DIAGNOSIS — R6883 Chills (without fever): Secondary | ICD-10-CM | POA: Diagnosis not present

## 2023-09-26 ENCOUNTER — Other Ambulatory Visit: Payer: Self-pay

## 2023-09-26 ENCOUNTER — Encounter (HOSPITAL_BASED_OUTPATIENT_CLINIC_OR_DEPARTMENT_OTHER): Payer: Self-pay | Admitting: Emergency Medicine

## 2023-09-26 ENCOUNTER — Other Ambulatory Visit (HOSPITAL_BASED_OUTPATIENT_CLINIC_OR_DEPARTMENT_OTHER): Payer: Self-pay

## 2023-09-26 ENCOUNTER — Emergency Department (HOSPITAL_BASED_OUTPATIENT_CLINIC_OR_DEPARTMENT_OTHER): Payer: Medicare PPO

## 2023-09-26 ENCOUNTER — Emergency Department (HOSPITAL_BASED_OUTPATIENT_CLINIC_OR_DEPARTMENT_OTHER)
Admission: EM | Admit: 2023-09-26 | Discharge: 2023-09-26 | Disposition: A | Discharge status: Home / Self Care | Payer: Medicare PPO | Attending: Emergency Medicine | Admitting: Emergency Medicine

## 2023-09-26 DIAGNOSIS — N200 Calculus of kidney: Secondary | ICD-10-CM | POA: Diagnosis not present

## 2023-09-26 DIAGNOSIS — Z20822 Contact with and (suspected) exposure to covid-19: Secondary | ICD-10-CM | POA: Diagnosis not present

## 2023-09-26 DIAGNOSIS — E039 Hypothyroidism, unspecified: Secondary | ICD-10-CM | POA: Diagnosis not present

## 2023-09-26 DIAGNOSIS — Z79899 Other long term (current) drug therapy: Secondary | ICD-10-CM | POA: Insufficient documentation

## 2023-09-26 DIAGNOSIS — N12 Tubulo-interstitial nephritis, not specified as acute or chronic: Secondary | ICD-10-CM | POA: Diagnosis not present

## 2023-09-26 DIAGNOSIS — R109 Unspecified abdominal pain: Secondary | ICD-10-CM | POA: Diagnosis not present

## 2023-09-26 DIAGNOSIS — R5383 Other fatigue: Secondary | ICD-10-CM | POA: Diagnosis not present

## 2023-09-26 DIAGNOSIS — K449 Diaphragmatic hernia without obstruction or gangrene: Secondary | ICD-10-CM | POA: Diagnosis not present

## 2023-09-26 LAB — COMPREHENSIVE METABOLIC PANEL
ALT: 15 U/L (ref 0–44)
AST: 13 U/L — ABNORMAL LOW (ref 15–41)
Albumin: 3.8 g/dL (ref 3.5–5.0)
Alkaline Phosphatase: 86 U/L (ref 38–126)
Anion gap: 13 (ref 5–15)
BUN: 25 mg/dL — ABNORMAL HIGH (ref 8–23)
CO2: 24 mmol/L (ref 22–32)
Calcium: 9.4 mg/dL (ref 8.9–10.3)
Chloride: 103 mmol/L (ref 98–111)
Creatinine, Ser: 1.35 mg/dL — ABNORMAL HIGH (ref 0.44–1.00)
GFR, Estimated: 41 mL/min — ABNORMAL LOW (ref >60)
Glucose, Bld: 152 mg/dL — ABNORMAL HIGH (ref 70–99)
Potassium: 3.5 mmol/L (ref 3.5–5.1)
Sodium: 140 mmol/L (ref 135–145)
Total Bilirubin: 0.7 mg/dL (ref 0.3–1.2)
Total Protein: 7.3 g/dL (ref 6.5–8.1)

## 2023-09-26 LAB — URINALYSIS, ROUTINE W REFLEX MICROSCOPIC
Bilirubin Urine: NEGATIVE
Glucose, UA: NEGATIVE mg/dL
Nitrite: NEGATIVE
Specific Gravity, Urine: 1.014 (ref 1.005–1.030)
WBC, UA: >50 WBC/hpf (ref 0–5)
pH: 6 (ref 5.0–8.0)

## 2023-09-26 LAB — CBC WITH DIFFERENTIAL/PLATELET
Abs Immature Granulocytes: 0.08 10*3/uL — ABNORMAL HIGH (ref 0.00–0.07)
Basophils Absolute: 0.1 10*3/uL (ref 0.0–0.1)
Basophils Relative: 0 %
Eosinophils Absolute: 0 10*3/uL (ref 0.0–0.5)
Eosinophils Relative: 0 %
HCT: 43.3 % (ref 36.0–46.0)
Hemoglobin: 14.5 g/dL (ref 12.0–15.0)
Immature Granulocytes: 1 %
Lymphocytes Relative: 11 %
Lymphs Abs: 1.9 10*3/uL (ref 0.7–4.0)
MCH: 30.2 pg (ref 26.0–34.0)
MCHC: 33.5 g/dL (ref 30.0–36.0)
MCV: 90.2 fL (ref 80.0–100.0)
Monocytes Absolute: 1.7 10*3/uL — ABNORMAL HIGH (ref 0.1–1.0)
Monocytes Relative: 10 %
Neutro Abs: 12.8 10*3/uL — ABNORMAL HIGH (ref 1.7–7.7)
Neutrophils Relative %: 78 %
Platelets: 296 10*3/uL (ref 150–400)
RBC: 4.8 MIL/uL (ref 3.87–5.11)
RDW: 12.7 % (ref 11.5–15.5)
WBC: 16.6 10*3/uL — ABNORMAL HIGH (ref 4.0–10.5)
nRBC: 0 % (ref 0.0–0.2)

## 2023-09-26 LAB — LIPASE, BLOOD: Lipase: <10 U/L — ABNORMAL LOW (ref 11–51)

## 2023-09-26 LAB — SARS CORONAVIRUS 2 BY RT PCR: SARS Coronavirus 2 by RT PCR: NEGATIVE

## 2023-09-26 MED ORDER — ONDANSETRON HCL 4 MG PO TABS
4.0000 mg | ORAL_TABLET | Freq: Four times a day (QID) | ORAL | 0 refills | Status: AC
  Filled 2023-09-26: qty 28, 7d supply, fill #0

## 2023-09-26 MED ORDER — IOHEXOL 300 MG/ML  SOLN
100.0000 mL | Freq: Once | INTRAMUSCULAR | Status: AC | PRN
  Administered 2023-09-26: 80 mL via INTRAVENOUS

## 2023-09-26 MED ORDER — SODIUM CHLORIDE 0.9 % IV SOLN
1.0000 g | Freq: Once | INTRAVENOUS | Status: AC
  Administered 2023-09-26: 1 g via INTRAVENOUS
  Filled 2023-09-26: qty 10

## 2023-09-26 MED ORDER — ONDANSETRON HCL 4 MG/2ML IJ SOLN
4.0000 mg | Freq: Once | INTRAMUSCULAR | Status: AC
  Administered 2023-09-26: 4 mg via INTRAVENOUS
  Filled 2023-09-26: qty 2

## 2023-09-26 MED ORDER — LACTATED RINGERS IV BOLUS
1000.0000 mL | Freq: Once | INTRAVENOUS | Status: AC
  Administered 2023-09-26: 1000 mL via INTRAVENOUS

## 2023-09-26 MED ORDER — CEPHALEXIN 500 MG PO CAPS
500.0000 mg | ORAL_CAPSULE | Freq: Four times a day (QID) | ORAL | 0 refills | Status: AC
  Filled 2023-09-26: qty 40, 10d supply, fill #0

## 2023-09-26 NOTE — ED Triage Notes (Signed)
Pt c/o decreased po intake, fatigue and concern for dehydration x 5 days. Saw PCP on Tuesday. Emesis if drinking large amt of fluid. Tx by EMS today

## 2023-09-26 NOTE — ED Provider Notes (Signed)
[place] EMERGENCY DEPARTMENT AT [facility] Provider Note   CSN: [id number] Arrival date & time: 09/26/23  1018     History  Chief Complaint  Patient presents with   Fatigue    Tracey Johnson is a 73 y.o. female.  73 year old female with a past medical history of hypothyroid presents to the ED with a chief complaint of generalized body aches, lethargic, decrease in oral intake and overall feeling bad for the past 5 days.  Evaluated by her primary care physician 2 days ago and she was diagnosed with a viral illness.  Patient reports she is feeling overall unwell.  Has had multiple episodes of nonbilious emesis.  States that she has been trying to increase her fluids and drink more, however every time she tries to drink some water this immediately comes up.  She is not endorsing any abdominal pain, aside from when she is dry heaving.  She has not tried taking any medication for improvement in her symptoms.  She does not have any prior episodes like these in the past.  Last bowel movement was yesterday but only a small amount this patient reports decrease in oral intake.  Denies any fevers but did have some chills, no chest pain, no shortness of breath, no cough.  The history is provided by the patient.       Home Medications Prior to Admission medications   Medication Sig Start Date End Date Taking? Authorizing Provider  cephALEXin (KEFLEX) 500 MG capsule Take 1 capsule (500 mg total) by mouth 4 (four) times daily for 10 days. 09/26/23 10/06/23 Yes Jaesean Litzau, PA-C  gabapentin (NEURONTIN) 300 MG capsule Take 300 mg by mouth 3 (three) times daily. 03/20/23  Yes [provider]  ondansetron (ZOFRAN) 4 MG tablet Take 1 tablet (4 mg total) by mouth every 6 (six) hours for 7 days. 09/26/23 10/03/23 Yes Antavion Bartoszek, Leonie Douglas, PA-C  rosuvastatin (CRESTOR) 20 MG tablet Take 20 mg by mouth daily. 07/30/23  Yes [provider]  albuterol (PROVENTIL HFA;VENTOLIN HFA) 108 (90 Base)  MCG/ACT inhaler albuterol sulfate HFA 90 mcg/actuation aerosol inhaler  INHALE 2 PUFFS EVERY 4-6 HOURS AS NEEDED FOR COUGHING PAROXYSMS/SHORTNESS OF BREATH    [provider]  celecoxib (CELEBREX) 200 MG capsule Take 200 mg by mouth daily.    [provider]  diclofenac sodium (VOLTAREN) 1 % GEL Apply 2 g topically 4 (four) times daily. Rub into affected area of foot 2 to 4 times daily 06/16/18   Vivi Barrack, DPM  HYDROcodone-acetaminophen (NORCO/VICODIN) 5-325 MG tablet Take 1 tablet by mouth every 6 (six) hours as needed. for pain 02/17/18   [provider]  levothyroxine (SYNTHROID, LEVOTHROID) 75 MCG tablet Take 75 mcg by mouth daily before breakfast.    [provider]  naproxen (NAPROSYN) 500 MG tablet Take 500 mg by mouth 2 (two) times daily with a meal.    [provider]  pravastatin (PRAVACHOL) 40 MG tablet Take 40 mg by mouth daily.  09/26/12   [provider]  tamsulosin (FLOMAX) 0.4 MG CAPS capsule tamsulosin 0.4 mg capsule  TAKE 1 CAPSULE BY MOUTH EVERY DAY    [provider]  tiZANidine (ZANAFLEX) 2 MG tablet Take one tablet by mouth twice daily.    [provider]  VIVELLE-DOT 0.075 MG/24HR APPLY 1 PATCH TWICE WEEKLY 04/10/12   Rickard Patience, PA-C      Allergies    Erythromycin, Penicillins, and Sulfa antibiotics    Review  of Systems   Review of Systems  Constitutional:  Positive for chills. Negative for fever.  HENT:  Negative for sore throat.   Respiratory:  Negative for shortness of breath.   Cardiovascular:  Negative for chest pain.  Gastrointestinal:  Positive for nausea and vomiting. Negative for abdominal pain.  Genitourinary:  Negative for flank pain.  All other systems reviewed and are negative.   Physical Exam Updated Vital Signs BP (!) 132/107   Pulse 95   Temp 98.4 F (36.9 C) (Oral)   Resp 18   Wt 64 kg   SpO2 92%   BMI 23.46 kg/m  Physical Exam Vitals and nursing note  reviewed.  Constitutional:      Appearance: Normal appearance.  HENT:     Head: Normocephalic and atraumatic.     Mouth/Throat:     Mouth: Mucous membranes are moist.  Cardiovascular:     Rate and Rhythm: Normal rate.  Pulmonary:     Effort: Pulmonary effort is normal.  Abdominal:     General: Abdomen is flat.     Palpations: Abdomen is soft.     Tenderness: There is no abdominal tenderness. There is right CVA tenderness and left CVA tenderness.  Musculoskeletal:     Cervical back: Normal range of motion and neck supple.  Skin:    General: Skin is warm and dry.  Neurological:     Mental Status: She is alert and oriented to person, place, and time.     ED Results / Procedures / Treatments   Labs (all labs ordered are listed, but only abnormal results are displayed) Labs Reviewed  CBC WITH DIFFERENTIAL/PLATELET - Abnormal; Notable for the following components:      Result Value   WBC 16.6 (*)    Neutro Abs 12.8 (*)    Monocytes Absolute 1.7 (*)    Abs Immature Granulocytes 0.08 (*)    All other components within normal limits  COMPREHENSIVE METABOLIC PANEL - Abnormal; Notable for the following components:   Glucose, Bld 152 (*)    BUN 25 (*)    Creatinine, Ser 1.35 (*)    AST 13 (*)    GFR, Estimated 41 (*)    All other components within normal limits  LIPASE, BLOOD - Abnormal; Notable for the following components:   Lipase <10 (*)    All other components within normal limits  URINALYSIS, ROUTINE W REFLEX MICROSCOPIC - Abnormal; Notable for the following components:   Hgb urine dipstick TRACE (*)    Ketones, ur TRACE (*)    Protein, ur TRACE (*)    Leukocytes,Ua LARGE (*)    Bacteria, UA MANY (*)    All other components within normal limits  SARS CORONAVIRUS 2 BY RT PCR    EKG None  Radiology CT ABDOMEN PELVIS W CONTRAST  Result Date: 09/26/2023 CLINICAL DATA:  Abdominal pain, acute, nonlocalized. Decreased oral intake, fatigue and concern for dehydration.  EXAM: CT ABDOMEN AND PELVIS WITH CONTRAST TECHNIQUE: Multidetector CT imaging of the abdomen and pelvis was performed using the standard protocol following bolus administration of intravenous contrast. RADIATION DOSE REDUCTION: This exam was performed according to the departmental dose-optimization program which includes automated exposure control, adjustment of the mA and/or kV according to patient size and/or use of iterative reconstruction technique. CONTRAST:  80mL OMNIPAQUE IOHEXOL 300 MG/ML  SOLN COMPARISON:  CT abdomen/pelvis 12/08/2022. FINDINGS: Lower chest: No acute abnormality. Hepatobiliary: No focal liver abnormality is seen. No gallstones, gallbladder wall thickening, or biliary  dilatation. Pancreas: Unremarkable. No pancreatic ductal dilatation or surrounding inflammatory changes. Spleen: Normal. Adrenals/Urinary Tract: The adrenal glands are unremarkable. No renal mass or hydronephrosis. Punctate calyceal calculi in the lower pole left kidney. Bladder is unremarkable. Stomach/Bowel: Small hiatal hernia. No dilated loops of small bowel. Appendix is not visualized. Colon is unremarkable. No bowel wall thickening or surrounding inflammation. Vascular/Lymphatic: Aortic atherosclerosis. No enlarged abdominal or pelvic lymph nodes. Reproductive: Status post hysterectomy. No adnexal masses. Other: No abdominal wall hernia or abnormality. No abdominopelvic ascites. Musculoskeletal: No acute or significant osseous findings. IMPRESSION: 1. No acute abdominopelvic abnormality. 2. Punctate nonobstructing left renal calculi. 3. Small hiatal hernia. Aortic Atherosclerosis (ICD10-I70.0). Electronically Signed   By: Orvan Falconer M.D.   On: 09/26/2023 14:27   DG Chest Portable 1 View  Result Date: 09/26/2023 CLINICAL DATA:  Fatigue. EXAM: PORTABLE CHEST 1 VIEW COMPARISON:  September 30, 2017. FINDINGS: The heart size and mediastinal contours are within normal limits. Both lungs are clear. The visualized skeletal  structures are unremarkable. IMPRESSION: No active disease. Electronically Signed   By: Lupita Raider M.D.   On: 09/26/2023 13:29    Procedures Procedures    Medications Ordered in ED Medications  lactated ringers bolus 1,000 mL (0 mLs Intravenous Stopped 09/26/23 1601)  iohexol (OMNIPAQUE) 300 MG/ML solution 100 mL (80 mLs Intravenous Contrast Given 09/26/23 1206)  cefTRIAXone (ROCEPHIN) 1 g in sodium chloride 0.9 % 100 mL IVPB (0 g Intravenous Stopped 09/26/23 1601)  ondansetron (ZOFRAN) injection 4 mg (4 mg Intravenous Given 09/26/23 1449)    ED Course/ Medical Decision Making/ A&P Clinical Course as of 09/26/23 1605  Thu Sep 26, 2023  1102 WBC(!): 16.6 [JS]  1322 Leukocytes,Ua(!): LARGE [JS]  1322 WBC, UA: >50 [JS]  1322 Bacteria, UA(!): MANY [JS]  1354 Creatinine(!): 1.35 [JS]    Clinical Course User Index [JS] Claude Manges, PA-C                                 Medical Decision Making Amount and/or Complexity of Data Reviewed Labs: ordered. Decision-making details documented in ED Course. Radiology: ordered.  Risk Prescription drug management.   This patient presents to the ED for concern of fatigue, this involves a number of treatment options, and is a complaint that carries with it a high risk of complications and morbidity.  The differential diagnosis includes viral illness, infection versus electrolyte abnormality.    Co morbidities: Discussed in HPI   Brief History:  See HPI.   EMR reviewed including pt PMHx, past surgical history and past visits to ER.   See HPI for more details   Lab Tests:  I ordered and independently interpreted labs.  The pertinent results include:    CBC with a leukocytosis of pyelonephritis, hemoglobin is within normal limits. CMP with elevation of  her BUN, creatine is slightly elevated at 1.35, LFTs are within normal limits. UA with many bacteria, >50 WBC Lipase is normal.    Imaging Studies:  DG chest xray showed no  acute findings.  CT abdomen pelvis with contrast:    Medicines ordered:  I ordered medication including bolus, rocephin  for tx of UTI versus pyelonephritis  Reevaluation of the patient after these medicines showed that the patient improved I have reviewed the patients home medicines and have made adjustments as needed  Reevaluation:  After the interventions noted above I re-evaluated patient and found that they have :improved  Social Determinants of Health:  The patient's social determinants of health were a factor in the care of this patient  Problem List / ED Course:  Patient here with nausea, vomiting, feeling overall ill for the last several days.  Evaluated by PCP 3 days ago, diagnosed with viral etiology.  Ports she has not been able to keep anything down, has been trying to hydrate herself with a lot of water but has continued to vomit.  She reports her PCP did not check any blood work, however she did have a negative COVID-19, influenza test.  She arrived to the ED hemodynamically stable, afebrile.  On exam there is bilateral flank tenderness, evaluation.  Her abdomen does have diminished bowel sounds, she reports her last bowel movement yesterday with small amount of stool but no blood present.  She has not been able to keep anything down.  Interpretation of her blood work revealed a CBC with a leukocytosis of 16.6, hemoglobin is stable.  CMP with no electrolyte derangement, however, elevated BUN and elevated creatinine, she does feel like she is somewhat dehydrated and I do agree.  Lipase levels normal.  Her UA does have large leukocytes, many bacteria greater than 50 white blood cell count.  There was bilateral CVA tenderness on my exam, I am concerned for pyelonephritis at this time.  She was given lactated Ringer's while in the emergency department, along with 1 g of Rocephin along with Zofran. She did have 1 episode of vomiting after trying to drink some water, therefore given  additional antiemetics.  Her COVID-19 and flu test are negative.  I did discuss with patient admission versus discharge at this time as she was not tolerating p.o., she reports she has not vomited in the past hour, she feels appropriate for going home at this time. CT abdomen and pelvis was also obtained, no infected stone present, no other abnormalities to her abdomen.  Chest x-ray was clear without any signs of pneumonia.  I do feel that patient can be trial for treatment of pyelonephritis with outpatient Keflex 4 times daily.  She is agreeable to plan and treatment, hemodynamically stable for discharge.   Dispostion:  After consideration of the diagnostic results and the patients response to treatment, I feel that the patent would benefit from treatment of pyelonephritis with Keflex.   Portions of this note were generated with Scientist, clinical (histocompatibility and immunogenetics). Dictation errors may occur despite best attempts at proofreading.   Final Clinical Impression(s) / ED Diagnoses Final diagnoses:  Pyelonephritis    Rx / DC Orders ED Discharge Orders          Ordered    ondansetron (ZOFRAN) 4 MG tablet  Every 6 hours        09/26/23 1436    cephALEXin (KEFLEX) 500 MG capsule  4 times daily        09/26/23 1559              Claude Manges, PA-C 09/26/23 1605    Virgina Norfolk, DO 09/26/23 1633

## 2023-09-26 NOTE — ED Notes (Signed)
Transported to CT 

## 2023-09-26 NOTE — Discharge Instructions (Addendum)
You were given a prescription for Keflex on today's visit, please take 1 tablet 4 times a day for the next 10 days.  In addition, you were given medication to help with your nausea, take 1 tablet every 6 hours as needed.  If you experience any worsening fever, nausea and vomiting please return to the emergency department.

## 2023-09-26 NOTE — ED Notes (Signed)
ED Provider at bedside. 

## 2023-10-03 DIAGNOSIS — Z0000 Encounter for general adult medical examination without abnormal findings: Secondary | ICD-10-CM | POA: Diagnosis not present

## 2023-10-03 DIAGNOSIS — N12 Tubulo-interstitial nephritis, not specified as acute or chronic: Secondary | ICD-10-CM | POA: Diagnosis not present

## 2023-10-03 DIAGNOSIS — Z720 Tobacco use: Secondary | ICD-10-CM | POA: Diagnosis not present

## 2023-10-03 DIAGNOSIS — N179 Acute kidney failure, unspecified: Secondary | ICD-10-CM | POA: Diagnosis not present

## 2023-10-03 DIAGNOSIS — Z1389 Encounter for screening for other disorder: Secondary | ICD-10-CM | POA: Diagnosis not present

## 2023-10-03 DIAGNOSIS — D72829 Elevated white blood cell count, unspecified: Secondary | ICD-10-CM | POA: Diagnosis not present

## 2023-10-30 ENCOUNTER — Telehealth: Payer: Self-pay

## 2023-10-30 NOTE — Telephone Encounter (Signed)
Transition Care Management Follow-up Telephone Call Date of discharge and from where: 09/26/2023 [facility] How have you been since you were released from the hospital? Patient stated she is feeling much better and received excellent care in the ED. Any questions or concerns? No  Items Reviewed: Did the pt receive and understand the discharge instructions provided? Yes  Medications obtained and verified? Yes  Other? No  Any new allergies since your discharge? No  Dietary orders reviewed? Yes Do you have support at home? Yes   Follow up appointments reviewed:  PCP Hospital f/u appt confirmed?  Patient stated she followed up with her PCP.  Scheduled to see  on  @ . Specialist Hospital f/u appt confirmed? No  Scheduled to see  on  @ . Are transportation arrangements needed? No  If their condition worsens, is the pt aware to call PCP or go to the Emergency Dept.? Yes Was the patient provided with contact information for the PCP's office or ED? Yes Was to pt encouraged to call back with questions or concerns? Yes   Freeland Pracht [facility]  [facility], [facility] Guide Direct Dial: [phone]  Website: [email or web address]

## 2023-11-11 DIAGNOSIS — E785 Hyperlipidemia, unspecified: Secondary | ICD-10-CM | POA: Diagnosis not present

## 2023-11-11 DIAGNOSIS — E039 Hypothyroidism, unspecified: Secondary | ICD-10-CM | POA: Diagnosis not present

## 2023-11-15 DIAGNOSIS — M48061 Spinal stenosis, lumbar region without neurogenic claudication: Secondary | ICD-10-CM | POA: Diagnosis not present

## 2023-11-15 DIAGNOSIS — F419 Anxiety disorder, unspecified: Secondary | ICD-10-CM | POA: Diagnosis not present

## 2023-11-15 DIAGNOSIS — R195 Other fecal abnormalities: Secondary | ICD-10-CM | POA: Diagnosis not present

## 2023-11-18 DIAGNOSIS — E039 Hypothyroidism, unspecified: Secondary | ICD-10-CM | POA: Diagnosis not present

## 2023-11-18 DIAGNOSIS — Z1339 Encounter for screening examination for other mental health and behavioral disorders: Secondary | ICD-10-CM | POA: Diagnosis not present

## 2023-11-18 DIAGNOSIS — Z0000 Encounter for general adult medical examination without abnormal findings: Secondary | ICD-10-CM | POA: Diagnosis not present

## 2023-11-18 DIAGNOSIS — Z720 Tobacco use: Secondary | ICD-10-CM | POA: Diagnosis not present

## 2023-11-18 DIAGNOSIS — K588 Other irritable bowel syndrome: Secondary | ICD-10-CM | POA: Diagnosis not present

## 2023-11-18 DIAGNOSIS — R7301 Impaired fasting glucose: Secondary | ICD-10-CM | POA: Diagnosis not present

## 2023-11-18 DIAGNOSIS — E785 Hyperlipidemia, unspecified: Secondary | ICD-10-CM | POA: Diagnosis not present

## 2023-11-18 DIAGNOSIS — M48061 Spinal stenosis, lumbar region without neurogenic claudication: Secondary | ICD-10-CM | POA: Diagnosis not present

## 2023-11-18 DIAGNOSIS — I872 Venous insufficiency (chronic) (peripheral): Secondary | ICD-10-CM | POA: Diagnosis not present

## 2023-11-18 DIAGNOSIS — Z1331 Encounter for screening for depression: Secondary | ICD-10-CM | POA: Diagnosis not present

## 2023-11-18 DIAGNOSIS — I700 Atherosclerosis of aorta: Secondary | ICD-10-CM | POA: Diagnosis not present

## 2023-11-21 DIAGNOSIS — M545 Low back pain, unspecified: Secondary | ICD-10-CM | POA: Diagnosis not present

## 2023-11-21 DIAGNOSIS — N1831 Chronic kidney disease, stage 3a: Secondary | ICD-10-CM | POA: Diagnosis not present

## 2023-11-21 DIAGNOSIS — M1990 Unspecified osteoarthritis, unspecified site: Secondary | ICD-10-CM | POA: Diagnosis not present

## 2023-11-21 DIAGNOSIS — J302 Other seasonal allergic rhinitis: Secondary | ICD-10-CM | POA: Diagnosis not present

## 2023-11-21 DIAGNOSIS — M62838 Other muscle spasm: Secondary | ICD-10-CM | POA: Diagnosis not present

## 2023-11-21 DIAGNOSIS — M792 Neuralgia and neuritis, unspecified: Secondary | ICD-10-CM | POA: Diagnosis not present

## 2023-11-21 DIAGNOSIS — R32 Unspecified urinary incontinence: Secondary | ICD-10-CM | POA: Diagnosis not present

## 2023-11-21 DIAGNOSIS — E785 Hyperlipidemia, unspecified: Secondary | ICD-10-CM | POA: Diagnosis not present

## 2023-11-21 DIAGNOSIS — E039 Hypothyroidism, unspecified: Secondary | ICD-10-CM | POA: Diagnosis not present

## 2023-11-27 ENCOUNTER — Other Ambulatory Visit: Payer: Self-pay | Admitting: Internal Medicine

## 2023-11-27 DIAGNOSIS — M48061 Spinal stenosis, lumbar region without neurogenic claudication: Secondary | ICD-10-CM

## 2023-12-13 NOTE — Discharge Instructions (Signed)

## 2023-12-16 ENCOUNTER — Ambulatory Visit
Admission: RE | Admit: 2023-12-16 | Discharge: 2023-12-16 | Disposition: A | Discharge status: Home / Self Care | Payer: Medicare PPO | Source: Ambulatory Visit | Attending: Internal Medicine | Admitting: Internal Medicine

## 2023-12-16 DIAGNOSIS — M48061 Spinal stenosis, lumbar region without neurogenic claudication: Secondary | ICD-10-CM | POA: Diagnosis not present

## 2023-12-16 DIAGNOSIS — M47816 Spondylosis without myelopathy or radiculopathy, lumbar region: Secondary | ICD-10-CM | POA: Diagnosis not present

## 2023-12-16 MED ORDER — METHYLPREDNISOLONE ACETATE 40 MG/ML INJ SUSP (RADIOLOG
80.0000 mg | Freq: Once | INTRAMUSCULAR | Status: AC
  Administered 2023-12-16: 80 mg via EPIDURAL

## 2023-12-16 MED ORDER — IOPAMIDOL (ISOVUE-M 200) INJECTION 41%
1.0000 mL | Freq: Once | INTRAMUSCULAR | Status: AC
  Administered 2023-12-16: 1 mL via EPIDURAL

## 2024-01-07 DIAGNOSIS — Z1231 Encounter for screening mammogram for malignant neoplasm of breast: Secondary | ICD-10-CM | POA: Diagnosis not present

## 2024-01-22 ENCOUNTER — Encounter: Payer: Self-pay | Admitting: Internal Medicine

## 2024-01-24 DIAGNOSIS — H04123 Dry eye syndrome of bilateral lacrimal glands: Secondary | ICD-10-CM | POA: Diagnosis not present

## 2024-01-24 DIAGNOSIS — H10413 Chronic giant papillary conjunctivitis, bilateral: Secondary | ICD-10-CM | POA: Diagnosis not present

## 2024-01-30 ENCOUNTER — Encounter: Payer: Medicare PPO | Admitting: Internal Medicine

## 2024-02-07 DIAGNOSIS — H04123 Dry eye syndrome of bilateral lacrimal glands: Secondary | ICD-10-CM | POA: Diagnosis not present

## 2024-02-21 ENCOUNTER — Ambulatory Visit: Payer: Medicare PPO

## 2024-02-21 VITALS — Ht 64.0 in | Wt 145.0 lb

## 2024-02-21 DIAGNOSIS — Z1211 Encounter for screening for malignant neoplasm of colon: Secondary | ICD-10-CM

## 2024-02-21 MED ORDER — SUFLAVE 178.7 G PO SOLR: 1.0000 | Freq: Once | ORAL | 0 refills | Status: AC

## 2024-02-21 NOTE — Progress Notes (Signed)

## 2024-03-03 ENCOUNTER — Telehealth: Payer: Self-pay | Admitting: Internal Medicine

## 2024-03-03 NOTE — Telephone Encounter (Signed)
 Patient insurance called and stated that they need a prior authorization for this patient colonoscopy. Please advise.

## 2024-03-03 NOTE — Telephone Encounter (Signed)
 The pt has been advised that the procedure has been approved.

## 2024-03-03 NOTE — Telephone Encounter (Signed)
 Tracey Johnson this would need to go to the pre cert team I have sent to them for review.

## 2024-03-06 ENCOUNTER — Ambulatory Visit: Payer: Medicare PPO | Admitting: Internal Medicine

## 2024-03-06 ENCOUNTER — Encounter: Payer: Medicare PPO | Admitting: Internal Medicine

## 2024-03-06 ENCOUNTER — Encounter: Payer: Self-pay | Admitting: Internal Medicine

## 2024-03-06 VITALS — BP 139/74 | HR 71 | Temp 97.9°F | Resp 19 | Ht 64.0 in | Wt 145.0 lb

## 2024-03-06 DIAGNOSIS — K648 Other hemorrhoids: Secondary | ICD-10-CM | POA: Diagnosis not present

## 2024-03-06 DIAGNOSIS — E039 Hypothyroidism, unspecified: Secondary | ICD-10-CM | POA: Diagnosis not present

## 2024-03-06 DIAGNOSIS — Z1211 Encounter for screening for malignant neoplasm of colon: Secondary | ICD-10-CM

## 2024-03-06 DIAGNOSIS — K5730 Diverticulosis of large intestine without perforation or abscess without bleeding: Secondary | ICD-10-CM | POA: Diagnosis not present

## 2024-03-06 DIAGNOSIS — E7800 Pure hypercholesterolemia, unspecified: Secondary | ICD-10-CM | POA: Diagnosis not present

## 2024-03-06 MED ORDER — SODIUM CHLORIDE 0.9 % IV SOLN: 500.0000 mL | Freq: Once | INTRAVENOUS | Status: DC

## 2024-03-06 NOTE — Op Note (Signed)
 [facility] Patient Name: Tracey Johnson Procedure Date: 03/06/2024 8:49 AM MRN: [id number] Endoscopist: Wilhemina Bonito. Marina Goodell , MD, [id number] Age: 74 Referring MD:  Date of Birth: 1950/12/30 Gender: Female Account #: [id number] Procedure:                Colonoscopy Indications:              Screening for colorectal malignant neoplasm.                            Previous examination 2014 was negative for neoplasia Medicines:                Monitored Anesthesia Care Procedure:                Pre-Anesthesia Assessment:                           - Prior to the procedure, a History and Physical                            was performed, and patient medications and                            allergies were reviewed. The patient's tolerance of                            previous anesthesia was also reviewed. The risks                            and benefits of the procedure and the sedation                            options and risks were discussed with the patient.                            All questions were answered, and informed consent                            was obtained. Prior Anticoagulants: The patient has                            taken no anticoagulant or antiplatelet agents. ASA                            Grade Assessment: II - A patient with mild systemic                            disease. After reviewing the risks and benefits,                            the patient was deemed in satisfactory condition to                            undergo the procedure.  After obtaining informed consent, the colonoscope                            was passed under direct vision. Throughout the                            procedure, the patient's blood pressure, pulse, and                            oxygen saturations were monitored continuously. The                            Olympus Scope SN: [id number] was introduced through                            the anus and  advanced to the the cecum, identified                            by appendiceal orifice and ileocecal valve. The                            ileocecal valve, appendiceal orifice, and rectum                            were photographed. The quality of the bowel                            preparation was excellent. The colonoscopy was                            performed without difficulty. The patient tolerated                            the procedure well. The bowel preparation used was                            SUPREP via split dose instruction. Scope In: 9:22:10 AM Scope Out: 9:35:58 AM Scope Withdrawal Time: 0 hours 10 minutes 46 seconds  Total Procedure Duration: 0 hours 13 minutes 48 seconds  Findings:                 Multiple diverticula were found in the sigmoid                            colon.                           Internal hemorrhoids were found during retroflexion.                           The exam was otherwise without abnormality on                            direct and retroflexion views. Complications:  No immediate complications. Estimated blood loss:                            None. Estimated Blood Loss:     Estimated blood loss: none. Impression:               - Diverticulosis in the sigmoid colon.                           - Internal hemorrhoids.                           - The examination was otherwise normal on direct                            and retroflexion views.                           - No specimens collected. Recommendation:           - Repeat colonoscopy is not recommended for                            screening purposes.                           - Patient has a contact number available for                            emergencies. The signs and symptoms of potential                            delayed complications were discussed with the                            patient. Return to normal activities tomorrow.                             Written discharge instructions were provided to the                            patient.                           - Resume previous diet.                           - Continue present medications. Wilhemina Bonito. Marina Goodell, MD 03/06/2024 9:39:59 AM This report has been signed electronically.

## 2024-03-06 NOTE — Patient Instructions (Signed)
 Repeat colonoscopy is not recommended for screening purposes.  YOU HAD AN ENDOSCOPIC PROCEDURE TODAY AT THE [facility]:   Refer to the procedure report that was given to you for any specific questions about what was found during the examination.  If the procedure report does not answer your questions, please call your gastroenterologist to clarify.  If you requested that your care partner not be given the details of your procedure findings, then the procedure report has been included in a sealed envelope for you to review at your convenience later.  YOU SHOULD EXPECT: Some feelings of bloating in the abdomen. Passage of more gas than usual.  Walking can help get rid of the air that was put into your GI tract during the procedure and reduce the bloating. If you had a lower endoscopy (such as a colonoscopy or flexible sigmoidoscopy) you may notice spotting of blood in your stool or on the toilet paper. If you underwent a bowel prep for your procedure, you may not have a normal bowel movement for a few days.  Please Note:  You might notice some irritation and congestion in your nose or some drainage.  This is from the oxygen used during your procedure.  There is no need for concern and it should clear up in a day or so.  SYMPTOMS TO REPORT IMMEDIATELY:  Following lower endoscopy (colonoscopy or flexible sigmoidoscopy):  Excessive amounts of blood in the stool  Significant tenderness or worsening of abdominal pains  Swelling of the abdomen that is new, acute  Fever of 100F or higher  For urgent or emergent issues, a gastroenterologist can be reached at any hour by calling [phone]. Do not use MyChart messaging for urgent concerns.    DIET:  We do recommend a small meal at first, but then you may proceed to your regular diet.  Drink plenty of fluids but you should avoid alcoholic beverages for 24 hours.  ACTIVITY:  You should plan to take it easy for the rest of today and you  should NOT DRIVE or use heavy machinery until tomorrow (because of the sedation medicines used during the test).    FOLLOW UP: Our staff will call the number listed on your records the next business day following your procedure.  We will call around 7:15- 8:00 am to check on you and address any questions or concerns that you may have regarding the information given to you following your procedure. If we do not reach you, we will leave a message.     If any biopsies were taken you will be contacted by phone or by letter within the next 1-3 weeks.  Please call us at [phone] if you have not heard about the biopsies in 3 weeks.    SIGNATURES/CONFIDENTIALITY: You and/or your care partner have signed paperwork which will be entered into your electronic medical record.  These signatures attest to the fact that that the information above on your After Visit Summary has been reviewed and is understood.  Full responsibility of the confidentiality of this discharge information lies with you and/or your care-partner.

## 2024-03-06 NOTE — Progress Notes (Signed)
 Pt's states no medical or surgical changes since previsit or office visit.

## 2024-03-06 NOTE — Progress Notes (Signed)
 HISTORY OF PRESENT ILLNESS:  Tracey Johnson is a 74 y.o. female resents for screening colonoscopy.  Negative index exam 2014  REVIEW OF SYSTEMS:  All non-GI ROS negative except for  Past Medical History:  Diagnosis Date   Arthritis    Hypercholesterolemia    Hypothyroidism     Past Surgical History:  Procedure Laterality Date   TONSILLECTOMY  1971   VAGINAL HYSTERECTOMY  1994    Social History Tracey Johnson  reports that she has been smoking cigarettes. She started smoking about 52 years ago. She has a 52.3 pack-year smoking history. She has never used smokeless tobacco. She reports current alcohol use. She reports that she does not use drugs.  family history includes Esophageal cancer in her father.  Allergies  Allergen Reactions   Erythromycin Rash    "most antibiotics"   Penicillins Rash    "Most antibiotics" Has patient had a PCN reaction causing immediate rash, facial/tongue/throat swelling, SOB or lightheadedness with hypotension: Yes Has patient had a PCN reaction causing severe rash involving mucus membranes or skin necrosis: yes Has patient had a PCN reaction that required hospitalization:No Has patient had a PCN reaction occurring within the last 10 years:No If all of the above answers are "NO", then may proceed with Cephalosporin use.    Sulfa Antibiotics Rash    "most antibiotics"       PHYSICAL EXAMINATION: Vital signs: BP (!) 149/73   Pulse 74   Temp 97.9 F (36.6 C)   Ht 5\' 4"  (1.626 m)   Wt 145 lb (65.8 kg)   SpO2 96%   BMI 24.89 kg/m  General: Well-developed, well-nourished, no acute distress HEENT: Sclerae are anicteric, conjunctiva pink. Oral mucosa intact Lungs: Clear Heart: Regular Abdomen: soft, nontender, nondistended, no obvious ascites, no peritoneal signs, normal bowel sounds. No organomegaly. Extremities: No edema Psychiatric: alert and oriented x3. Cooperative     ASSESSMENT:  Colon cancer  screening   PLAN:  Screening colonoscopy

## 2024-03-09 ENCOUNTER — Telehealth: Payer: Self-pay

## 2024-03-09 NOTE — Telephone Encounter (Signed)
 No answer, left message to call if having any issues or concerns, B.Vale Mousseau RN

## 2024-04-15 DIAGNOSIS — R0602 Shortness of breath: Secondary | ICD-10-CM | POA: Diagnosis not present

## 2024-04-15 DIAGNOSIS — R0981 Nasal congestion: Secondary | ICD-10-CM | POA: Diagnosis not present

## 2024-04-15 DIAGNOSIS — Z1152 Encounter for screening for COVID-19: Secondary | ICD-10-CM | POA: Diagnosis not present

## 2024-04-15 DIAGNOSIS — Z720 Tobacco use: Secondary | ICD-10-CM | POA: Diagnosis not present

## 2024-04-15 DIAGNOSIS — R5383 Other fatigue: Secondary | ICD-10-CM | POA: Diagnosis not present

## 2024-04-15 DIAGNOSIS — R059 Cough, unspecified: Secondary | ICD-10-CM | POA: Diagnosis not present

## 2024-05-22 ENCOUNTER — Other Ambulatory Visit: Payer: Self-pay | Admitting: Internal Medicine

## 2024-05-22 DIAGNOSIS — M545 Low back pain, unspecified: Secondary | ICD-10-CM

## 2024-05-22 DIAGNOSIS — M5416 Radiculopathy, lumbar region: Secondary | ICD-10-CM

## 2024-05-22 DIAGNOSIS — M48061 Spinal stenosis, lumbar region without neurogenic claudication: Secondary | ICD-10-CM

## 2024-05-23 DIAGNOSIS — R058 Other specified cough: Secondary | ICD-10-CM | POA: Diagnosis not present

## 2024-06-02 NOTE — Discharge Instructions (Signed)

## 2024-06-04 ENCOUNTER — Ambulatory Visit
Admission: RE | Admit: 2024-06-04 | Discharge: 2024-06-04 | Disposition: A | Discharge status: Home / Self Care | Source: Ambulatory Visit | Attending: Internal Medicine | Admitting: Internal Medicine

## 2024-06-04 DIAGNOSIS — M48061 Spinal stenosis, lumbar region without neurogenic claudication: Secondary | ICD-10-CM | POA: Diagnosis not present

## 2024-06-04 DIAGNOSIS — M47817 Spondylosis without myelopathy or radiculopathy, lumbosacral region: Secondary | ICD-10-CM | POA: Diagnosis not present

## 2024-06-04 DIAGNOSIS — M545 Low back pain, unspecified: Secondary | ICD-10-CM

## 2024-06-04 DIAGNOSIS — M5416 Radiculopathy, lumbar region: Secondary | ICD-10-CM

## 2024-06-04 MED ORDER — IOPAMIDOL (ISOVUE-M 200) INJECTION 41%
1.0000 mL | Freq: Once | INTRAMUSCULAR | Status: AC
  Administered 2024-06-04: 1 mL via EPIDURAL

## 2024-06-04 MED ORDER — METHYLPREDNISOLONE ACETATE 40 MG/ML INJ SUSP (RADIOLOG
80.0000 mg | Freq: Once | INTRAMUSCULAR | Status: AC
  Administered 2024-06-04: 80 mg via EPIDURAL

## 2024-06-15 DIAGNOSIS — R7301 Impaired fasting glucose: Secondary | ICD-10-CM | POA: Diagnosis not present

## 2024-06-15 DIAGNOSIS — Z720 Tobacco use: Secondary | ICD-10-CM | POA: Diagnosis not present

## 2024-06-15 DIAGNOSIS — J449 Chronic obstructive pulmonary disease, unspecified: Secondary | ICD-10-CM | POA: Diagnosis not present

## 2024-06-15 DIAGNOSIS — M792 Neuralgia and neuritis, unspecified: Secondary | ICD-10-CM | POA: Diagnosis not present

## 2024-06-15 DIAGNOSIS — E039 Hypothyroidism, unspecified: Secondary | ICD-10-CM | POA: Diagnosis not present

## 2024-06-15 DIAGNOSIS — M48061 Spinal stenosis, lumbar region without neurogenic claudication: Secondary | ICD-10-CM | POA: Diagnosis not present

## 2024-06-29 DIAGNOSIS — R058 Other specified cough: Secondary | ICD-10-CM | POA: Diagnosis not present

## 2024-06-29 DIAGNOSIS — Z720 Tobacco use: Secondary | ICD-10-CM | POA: Diagnosis not present

## 2024-06-29 DIAGNOSIS — J4521 Mild intermittent asthma with (acute) exacerbation: Secondary | ICD-10-CM | POA: Diagnosis not present

## 2024-06-29 DIAGNOSIS — J209 Acute bronchitis, unspecified: Secondary | ICD-10-CM | POA: Diagnosis not present

## 2024-07-15 DIAGNOSIS — H73891 Other specified disorders of tympanic membrane, right ear: Secondary | ICD-10-CM | POA: Diagnosis not present

## 2024-07-15 DIAGNOSIS — J309 Allergic rhinitis, unspecified: Secondary | ICD-10-CM | POA: Diagnosis not present

## 2024-07-15 DIAGNOSIS — K219 Gastro-esophageal reflux disease without esophagitis: Secondary | ICD-10-CM | POA: Diagnosis not present

## 2024-09-17 DIAGNOSIS — H52203 Unspecified astigmatism, bilateral: Secondary | ICD-10-CM | POA: Diagnosis not present

## 2024-09-17 DIAGNOSIS — H353131 Nonexudative age-related macular degeneration, bilateral, early dry stage: Secondary | ICD-10-CM | POA: Diagnosis not present

## 2024-09-17 DIAGNOSIS — H04123 Dry eye syndrome of bilateral lacrimal glands: Secondary | ICD-10-CM | POA: Diagnosis not present

## 2024-09-17 DIAGNOSIS — H35373 Puckering of macula, bilateral: Secondary | ICD-10-CM | POA: Diagnosis not present

## 2024-09-17 DIAGNOSIS — H26491 Other secondary cataract, right eye: Secondary | ICD-10-CM | POA: Diagnosis not present

## 2024-09-17 DIAGNOSIS — H43813 Vitreous degeneration, bilateral: Secondary | ICD-10-CM | POA: Diagnosis not present

## 2024-09-17 DIAGNOSIS — H524 Presbyopia: Secondary | ICD-10-CM | POA: Diagnosis not present

## 2024-10-01 DIAGNOSIS — R5383 Other fatigue: Secondary | ICD-10-CM | POA: Diagnosis not present

## 2024-10-01 DIAGNOSIS — R0981 Nasal congestion: Secondary | ICD-10-CM | POA: Diagnosis not present

## 2024-10-01 DIAGNOSIS — J441 Chronic obstructive pulmonary disease with (acute) exacerbation: Secondary | ICD-10-CM | POA: Diagnosis not present

## 2024-10-01 DIAGNOSIS — Z1152 Encounter for screening for COVID-19: Secondary | ICD-10-CM | POA: Diagnosis not present

## 2024-10-01 DIAGNOSIS — R0602 Shortness of breath: Secondary | ICD-10-CM | POA: Diagnosis not present

## 2024-10-01 DIAGNOSIS — R509 Fever, unspecified: Secondary | ICD-10-CM | POA: Diagnosis not present

## 2024-10-01 DIAGNOSIS — R058 Other specified cough: Secondary | ICD-10-CM | POA: Diagnosis not present

## 2024-10-02 DIAGNOSIS — R531 Weakness: Secondary | ICD-10-CM | POA: Diagnosis not present

## 2024-10-02 DIAGNOSIS — R509 Fever, unspecified: Secondary | ICD-10-CM | POA: Diagnosis not present

## 2024-11-16 DIAGNOSIS — M1990 Unspecified osteoarthritis, unspecified site: Secondary | ICD-10-CM | POA: Diagnosis not present

## 2024-11-16 DIAGNOSIS — J4489 Other specified chronic obstructive pulmonary disease: Secondary | ICD-10-CM | POA: Diagnosis not present

## 2024-11-16 DIAGNOSIS — Z809 Family history of malignant neoplasm, unspecified: Secondary | ICD-10-CM | POA: Diagnosis not present

## 2024-11-16 DIAGNOSIS — N1831 Chronic kidney disease, stage 3a: Secondary | ICD-10-CM | POA: Diagnosis not present

## 2024-11-16 DIAGNOSIS — M62838 Other muscle spasm: Secondary | ICD-10-CM | POA: Diagnosis not present

## 2024-11-16 DIAGNOSIS — F325 Major depressive disorder, single episode, in full remission: Secondary | ICD-10-CM | POA: Diagnosis not present

## 2024-11-16 DIAGNOSIS — H269 Unspecified cataract: Secondary | ICD-10-CM | POA: Diagnosis not present

## 2024-11-16 DIAGNOSIS — E785 Hyperlipidemia, unspecified: Secondary | ICD-10-CM | POA: Diagnosis not present

## 2024-11-16 DIAGNOSIS — M4800 Spinal stenosis, site unspecified: Secondary | ICD-10-CM | POA: Diagnosis not present

## 2024-12-30 ENCOUNTER — Other Ambulatory Visit: Payer: Self-pay | Admitting: Internal Medicine

## 2024-12-30 DIAGNOSIS — M48061 Spinal stenosis, lumbar region without neurogenic claudication: Secondary | ICD-10-CM

## 2025-01-07 ENCOUNTER — Encounter: Payer: Self-pay | Admitting: Internal Medicine

## 2025-01-08 ENCOUNTER — Ambulatory Visit
Admission: RE | Admit: 2025-01-08 | Discharge: 2025-01-08 | Disposition: A | Source: Ambulatory Visit | Attending: Internal Medicine | Admitting: Internal Medicine

## 2025-01-08 DIAGNOSIS — M48061 Spinal stenosis, lumbar region without neurogenic claudication: Secondary | ICD-10-CM

## 2025-01-08 MED ORDER — IOPAMIDOL (ISOVUE-M 200) INJECTION 41%
1.0000 mL | Freq: Once | INTRAMUSCULAR | Status: AC
  Administered 2025-01-08: 1 mL via EPIDURAL

## 2025-01-08 MED ORDER — METHYLPREDNISOLONE ACETATE 40 MG/ML INJ SUSP (RADIOLOG
80.0000 mg | Freq: Once | INTRAMUSCULAR | Status: AC
  Administered 2025-01-08: 80 mg via EPIDURAL
